# Patient Record
Sex: Male | Born: 1970 | Race: White | Hispanic: No | Marital: Married | Smoking: Never smoker
Health system: Southern US, Community
[De-identification: ages and names within clinical notes are randomized; demographics above are authoritative.]

## PROBLEM LIST (undated history)

## (undated) DIAGNOSIS — R6510 Systemic inflammatory response syndrome (SIRS) of non-infectious origin without acute organ dysfunction: Secondary | ICD-10-CM

## (undated) DIAGNOSIS — M549 Dorsalgia, unspecified: Secondary | ICD-10-CM

## (undated) DIAGNOSIS — S92901A Unspecified fracture of right foot, initial encounter for closed fracture: Secondary | ICD-10-CM

## (undated) DIAGNOSIS — IMO0002 Reserved for concepts with insufficient information to code with codable children: Secondary | ICD-10-CM

## (undated) DIAGNOSIS — S2249XA Multiple fractures of ribs, unspecified side, initial encounter for closed fracture: Secondary | ICD-10-CM

## (undated) DIAGNOSIS — S2239XA Fracture of one rib, unspecified side, initial encounter for closed fracture: Secondary | ICD-10-CM

## (undated) DIAGNOSIS — S92902A Unspecified fracture of left foot, initial encounter for closed fracture: Secondary | ICD-10-CM

## (undated) DIAGNOSIS — R45851 Suicidal ideations: Secondary | ICD-10-CM

## (undated) DIAGNOSIS — G8929 Other chronic pain: Secondary | ICD-10-CM

## (undated) DIAGNOSIS — T148XXA Other injury of unspecified body region, initial encounter: Secondary | ICD-10-CM

## (undated) HISTORY — PX: ROTATOR CUFF REPAIR: SHX139

---

## 1998-04-30 ENCOUNTER — Emergency Department (HOSPITAL_COMMUNITY): Admission: EM | Admit: 1998-04-30 | Discharge: 1998-04-30 | Payer: Self-pay | Admitting: Emergency Medicine

## 1998-05-27 ENCOUNTER — Emergency Department (HOSPITAL_COMMUNITY): Admission: EM | Admit: 1998-05-27 | Discharge: 1998-05-27 | Payer: Self-pay | Admitting: Emergency Medicine

## 1998-12-09 ENCOUNTER — Inpatient Hospital Stay (HOSPITAL_COMMUNITY): Admission: AD | Admit: 1998-12-09 | Discharge: 1998-12-11 | Payer: Self-pay | Admitting: *Deleted

## 2002-07-04 ENCOUNTER — Emergency Department (HOSPITAL_COMMUNITY): Admission: EM | Admit: 2002-07-04 | Discharge: 2002-07-04 | Payer: Self-pay

## 2002-07-04 ENCOUNTER — Encounter: Payer: Self-pay | Admitting: Emergency Medicine

## 2003-12-07 ENCOUNTER — Emergency Department (HOSPITAL_COMMUNITY): Admission: AD | Admit: 2003-12-07 | Discharge: 2003-12-07 | Payer: Self-pay | Admitting: Family Medicine

## 2005-03-29 ENCOUNTER — Emergency Department (HOSPITAL_COMMUNITY): Admission: EM | Admit: 2005-03-29 | Discharge: 2005-03-29 | Payer: Self-pay | Admitting: Family Medicine

## 2005-07-12 ENCOUNTER — Emergency Department (HOSPITAL_COMMUNITY): Admission: EM | Admit: 2005-07-12 | Discharge: 2005-07-12 | Payer: Self-pay | Admitting: Family Medicine

## 2006-06-16 ENCOUNTER — Emergency Department (HOSPITAL_COMMUNITY): Admission: EM | Admit: 2006-06-16 | Discharge: 2006-06-16 | Payer: Self-pay | Admitting: Emergency Medicine

## 2006-08-09 ENCOUNTER — Emergency Department (HOSPITAL_COMMUNITY): Admission: AD | Admit: 2006-08-09 | Discharge: 2006-08-09 | Payer: Self-pay | Admitting: Emergency Medicine

## 2007-03-20 ENCOUNTER — Emergency Department (HOSPITAL_COMMUNITY): Admission: EM | Admit: 2007-03-20 | Discharge: 2007-03-20 | Payer: Self-pay | Admitting: *Deleted

## 2007-11-20 ENCOUNTER — Ambulatory Visit (HOSPITAL_COMMUNITY): Admission: RE | Admit: 2007-11-20 | Discharge: 2007-11-20 | Payer: Self-pay | Admitting: Internal Medicine

## 2008-04-11 ENCOUNTER — Emergency Department (HOSPITAL_COMMUNITY): Admission: EM | Admit: 2008-04-11 | Discharge: 2008-04-11 | Payer: Self-pay | Admitting: Emergency Medicine

## 2009-08-03 ENCOUNTER — Emergency Department (HOSPITAL_COMMUNITY): Admission: EM | Admit: 2009-08-03 | Discharge: 2009-08-03 | Payer: Self-pay | Admitting: Emergency Medicine

## 2009-10-22 ENCOUNTER — Emergency Department (HOSPITAL_COMMUNITY): Admission: EM | Admit: 2009-10-22 | Discharge: 2009-10-22 | Payer: Self-pay | Admitting: Family Medicine

## 2010-11-04 ENCOUNTER — Ambulatory Visit (HOSPITAL_COMMUNITY): Admission: RE | Admit: 2010-11-04 | Discharge: 2010-11-04 | Payer: Self-pay | Admitting: Internal Medicine

## 2011-01-08 ENCOUNTER — Emergency Department (HOSPITAL_COMMUNITY)
Admission: EM | Admit: 2011-01-08 | Discharge: 2011-01-08 | Payer: Self-pay | Source: Home / Self Care | Admitting: Family Medicine

## 2011-01-11 LAB — POCT URINALYSIS DIPSTICK
Bilirubin Urine: NEGATIVE
Hgb urine dipstick: NEGATIVE
Ketones, ur: NEGATIVE mg/dL
Nitrite: NEGATIVE
Protein, ur: NEGATIVE mg/dL
Specific Gravity, Urine: 1.025 (ref 1.005–1.030)
Urine Glucose, Fasting: NEGATIVE mg/dL
Urobilinogen, UA: 0.2 mg/dL (ref 0.0–1.0)
pH: 5.5 (ref 5.0–8.0)

## 2011-01-17 ENCOUNTER — Encounter: Payer: Self-pay | Admitting: Family Medicine

## 2011-02-04 ENCOUNTER — Inpatient Hospital Stay (INDEPENDENT_AMBULATORY_CARE_PROVIDER_SITE_OTHER)
Admission: RE | Admit: 2011-02-04 | Discharge: 2011-02-04 | Disposition: A | Discharge status: Home / Self Care | Payer: BC Managed Care – PPO | Source: Ambulatory Visit | Attending: Family Medicine | Admitting: Family Medicine

## 2011-02-04 DIAGNOSIS — K047 Periapical abscess without sinus: Secondary | ICD-10-CM

## 2011-09-21 LAB — URINALYSIS, ROUTINE W REFLEX MICROSCOPIC
Bilirubin Urine: NEGATIVE
Glucose, UA: NEGATIVE
Hgb urine dipstick: NEGATIVE
Ketones, ur: NEGATIVE
Nitrite: NEGATIVE
Protein, ur: NEGATIVE
Specific Gravity, Urine: 1.025
Urobilinogen, UA: 0.2
pH: 6

## 2012-06-05 ENCOUNTER — Encounter (HOSPITAL_COMMUNITY): Payer: Self-pay

## 2012-06-05 ENCOUNTER — Emergency Department (HOSPITAL_COMMUNITY): Payer: BC Managed Care – PPO

## 2012-06-05 ENCOUNTER — Emergency Department (HOSPITAL_COMMUNITY)
Admission: EM | Admit: 2012-06-05 | Discharge: 2012-06-05 | Disposition: A | Discharge status: Home / Self Care | Payer: BC Managed Care – PPO | Attending: Emergency Medicine | Admitting: Emergency Medicine

## 2012-06-05 DIAGNOSIS — M25519 Pain in unspecified shoulder: Secondary | ICD-10-CM | POA: Insufficient documentation

## 2012-06-05 DIAGNOSIS — T148XXA Other injury of unspecified body region, initial encounter: Secondary | ICD-10-CM

## 2012-06-05 DIAGNOSIS — G8929 Other chronic pain: Secondary | ICD-10-CM | POA: Insufficient documentation

## 2012-06-05 DIAGNOSIS — M549 Dorsalgia, unspecified: Secondary | ICD-10-CM | POA: Insufficient documentation

## 2012-06-05 DIAGNOSIS — Z043 Encounter for examination and observation following other accident: Secondary | ICD-10-CM | POA: Insufficient documentation

## 2012-06-05 HISTORY — DX: Other chronic pain: G89.29

## 2012-06-05 HISTORY — DX: Multiple fractures of ribs, unspecified side, initial encounter for closed fracture: S22.49XA

## 2012-06-05 HISTORY — DX: Fracture of one rib, unspecified side, initial encounter for closed fracture: S22.39XA

## 2012-06-05 HISTORY — DX: Dorsalgia, unspecified: M54.9

## 2012-06-05 HISTORY — DX: Reserved for concepts with insufficient information to code with codable children: IMO0002

## 2012-06-05 HISTORY — DX: Unspecified fracture of right foot, initial encounter for closed fracture: S92.901A

## 2012-06-05 HISTORY — DX: Unspecified fracture of left foot, initial encounter for closed fracture: S92.902A

## 2012-06-05 MED ORDER — OXYCODONE-ACETAMINOPHEN 5-325 MG PO TABS: 1.0000 | ORAL_TABLET | ORAL | Status: DC | PRN

## 2012-06-05 MED ORDER — IBUPROFEN 600 MG PO TABS: 600.0000 mg | ORAL_TABLET | Freq: Four times a day (QID) | ORAL | Status: DC | PRN
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
## 2012-06-12 ENCOUNTER — Emergency Department (HOSPITAL_COMMUNITY)
Admission: EM | Admit: 2012-06-12 | Discharge: 2012-06-12 | Disposition: A | Discharge status: Home / Self Care | Payer: No Typology Code available for payment source

## 2012-06-13 ENCOUNTER — Emergency Department (HOSPITAL_COMMUNITY)
Admission: EM | Admit: 2012-06-13 | Discharge: 2012-06-13 | Disposition: A | Discharge status: Home / Self Care | Payer: BC Managed Care – PPO | Attending: Emergency Medicine | Admitting: Emergency Medicine

## 2012-06-13 ENCOUNTER — Encounter (HOSPITAL_COMMUNITY): Payer: Self-pay | Admitting: Emergency Medicine

## 2012-06-13 DIAGNOSIS — S139XXA Sprain of joints and ligaments of unspecified parts of neck, initial encounter: Secondary | ICD-10-CM | POA: Insufficient documentation

## 2012-06-13 DIAGNOSIS — M62838 Other muscle spasm: Secondary | ICD-10-CM

## 2012-06-13 DIAGNOSIS — Z880 Allergy status to penicillin: Secondary | ICD-10-CM | POA: Insufficient documentation

## 2012-06-13 DIAGNOSIS — G8929 Other chronic pain: Secondary | ICD-10-CM | POA: Insufficient documentation

## 2012-06-13 DIAGNOSIS — S161XXA Strain of muscle, fascia and tendon at neck level, initial encounter: Secondary | ICD-10-CM

## 2012-06-13 MED ORDER — KETOROLAC TROMETHAMINE 60 MG/2ML IM SOLN
60.0000 mg | Freq: Once | INTRAMUSCULAR | Status: AC
  Administered 2012-06-13: 60 mg via INTRAMUSCULAR
  Filled 2012-06-13: qty 2

## 2012-06-13 MED ORDER — DIAZEPAM 5 MG PO TABS
5.0000 mg | ORAL_TABLET | Freq: Three times a day (TID) | ORAL | Status: AC | PRN
Start: 2012-06-13 — End: 2012-06-23

## 2012-06-13 MED ORDER — DIAZEPAM 5 MG PO TABS
5.0000 mg | ORAL_TABLET | Freq: Once | ORAL | Status: AC
  Administered 2012-06-13: 5 mg via ORAL
  Filled 2012-06-13: qty 1

## 2012-06-13 MED ORDER — HYDROCODONE-ACETAMINOPHEN 5-325 MG PO TABS
1.0000 | ORAL_TABLET | Freq: Once | ORAL | Status: AC
  Administered 2012-06-13: 1 via ORAL
  Filled 2012-06-13: qty 1

## 2012-06-13 MED ORDER — HYDROCODONE-ACETAMINOPHEN 5-325 MG PO TABS: ORAL_TABLET | ORAL | Status: AC
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
## 2012-06-13 NOTE — Discharge Instructions (Signed)
Cervical Sprain A cervical sprain is an injury in the neck in which the ligaments are stretched or torn. The ligaments are the tissues that hold the bones of the neck (vertebrae) in place.Cervical sprains can range from very mild to very severe. Most cervical sprains get better in 1 to 3 weeks, but it depends on the cause and extent of the injury. Severe cervical sprains can cause the neck vertebrae to be unstable. This can lead to damage of the spinal cord and can result in serious nervous system problems. Your caregiver will determine whether your cervical sprain is mild or severe. CAUSES  Severe cervical sprains may be caused by:  Contact sport injuries (football, rugby, wrestling, hockey, auto racing, gymnastics, diving, martial arts, boxing).   Motor vehicle collisions.   Whiplash injuries. This means the neck is forcefully whipped backward and forward.   Falls.  Mild cervical sprains may be caused by:   Awkward positions, such as cradling a telephone between your ear and shoulder.   Sitting in a chair that does not offer proper support.   Working at a poorly designed computer station.   Activities that require looking up or down for long periods of time.  SYMPTOMS   Pain, soreness, stiffness, or a burning sensation in the front, back, or sides of the neck. This discomfort may develop immediately after injury or it may develop slowly and not begin for 24 hours or more after an injury.   Pain or tenderness directly in the middle of the back of the neck.   Shoulder or upper back pain.   Limited ability to move the neck.   Headache.   Dizziness.   Weakness, numbness, or tingling in the hands or arms.   Muscle spasms.   Difficulty swallowing or chewing.   Tenderness and swelling of the neck.  DIAGNOSIS  Most of the time, your caregiver can diagnose this problem by taking your history and doing a physical exam. Your caregiver will ask about any known problems, such as  arthritis in the neck or a previous neck injury. X-rays may be taken to find out if there are any other problems, such as problems with the bones of the neck. However, an X-ray often does not reveal the full extent of a cervical sprain. Other tests such as a computed tomography (CT) scan or magnetic resonance imaging (MRI) may be needed. TREATMENT  Treatment depends on the severity of the cervical sprain. Mild sprains can be treated with rest, keeping the neck in place (immobilization), and pain medicines. Severe cervical sprains need immediate immobilization and an appointment with an orthopedist or neurosurgeon. Several treatment options are available to help with pain, muscle spasms, and other symptoms. Your caregiver may prescribe:  Medicines, such as pain relievers, numbing medicines, or muscle relaxants.   Physical therapy. This can include stretching exercises, strengthening exercises, and posture training. Exercises and improved posture can help stabilize the neck, strengthen muscles, and help stop symptoms from returning.   A neck collar to be worn for short periods of time. Often, these collars are worn for comfort. However, certain collars may be worn to protect the neck and prevent further worsening of a serious cervical sprain.  HOME CARE INSTRUCTIONS   Put ice on the injured area.   Put ice in a plastic bag.   Place a towel between your skin and the bag.   Leave the ice on for 15 to 20 minutes, 3 to 4 times a day.     Only take over-the-counter or prescription medicines for pain, discomfort, or fever as directed by your caregiver.   Keep all follow-up appointments as directed by your caregiver.   Keep all physical therapy appointments as directed by your caregiver.   If a neck collar is prescribed, wear it as directed by your caregiver.   Do not drive while wearing a neck collar.   Make any needed adjustments to your work station to promote good posture.   Avoid positions  and activities that make your symptoms worse.   Warm up and stretch before being active to help prevent problems.  SEEK MEDICAL CARE IF:   Your pain is not controlled with medicine.   You are unable to decrease your pain medicine over time as planned.   Your activity level is not improving as expected.  SEEK IMMEDIATE MEDICAL CARE IF:   You develop any bleeding, stomach upset, or signs of an allergic reaction to your medicine.   Your symptoms get worse.   You develop new, unexplained symptoms.   You have numbness, tingling, weakness, or paralysis in any part of your body.  MAKE SURE YOU:   Understand these instructions.   Will watch your condition.   Will get help right away if you are not doing well or get worse.  Document Released: 10/10/2007 Document Revised: 12/02/2011 Document Reviewed: 09/15/2011 [employer] Patient Information 2012 [employer].    Narcotic and benzodiazepine use may cause drowsiness, slowed breathing or dependence.  Please use with caution and do not drive, operate machinery or watch young children alone while taking them.  Taking combinations of these medications or drinking alcohol will potentiate these effects.    

## 2012-06-19 ENCOUNTER — Other Ambulatory Visit (HOSPITAL_COMMUNITY): Payer: Self-pay | Admitting: Chiropractic Medicine

## 2012-06-19 ENCOUNTER — Ambulatory Visit (HOSPITAL_COMMUNITY)
Admission: RE | Admit: 2012-06-19 | Discharge: 2012-06-19 | Disposition: A | Discharge status: Home / Self Care | Payer: BC Managed Care – PPO | Source: Ambulatory Visit | Attending: Chiropractic Medicine | Admitting: Chiropractic Medicine

## 2012-06-19 DIAGNOSIS — M47812 Spondylosis without myelopathy or radiculopathy, cervical region: Secondary | ICD-10-CM | POA: Insufficient documentation

## 2012-06-19 DIAGNOSIS — M542 Cervicalgia: Secondary | ICD-10-CM

## 2012-06-19 DIAGNOSIS — M79609 Pain in unspecified limb: Secondary | ICD-10-CM | POA: Insufficient documentation

## 2012-10-17 ENCOUNTER — Encounter (HOSPITAL_COMMUNITY): Payer: Self-pay | Admitting: Cardiology

## 2012-10-17 ENCOUNTER — Emergency Department (HOSPITAL_COMMUNITY)
Admission: EM | Admit: 2012-10-17 | Discharge: 2012-10-17 | Disposition: A | Discharge status: Home / Self Care | Payer: Self-pay | Attending: Emergency Medicine | Admitting: Emergency Medicine

## 2012-10-17 DIAGNOSIS — F19939 Other psychoactive substance use, unspecified with withdrawal, unspecified: Secondary | ICD-10-CM | POA: Insufficient documentation

## 2012-10-17 DIAGNOSIS — M549 Dorsalgia, unspecified: Secondary | ICD-10-CM | POA: Insufficient documentation

## 2012-10-17 DIAGNOSIS — R110 Nausea: Secondary | ICD-10-CM | POA: Insufficient documentation

## 2012-10-17 NOTE — ED Notes (Signed)
Pt reports hx of back pain that he has been taking pain medication for and has run out. Reports that he now has generalized nausea and back pain. Thinks that he may be going through withdrawal from medication.

## 2013-07-03 ENCOUNTER — Emergency Department (HOSPITAL_COMMUNITY)
Admission: EM | Admit: 2013-07-03 | Discharge: 2013-07-03 | Disposition: A | Discharge status: Home / Self Care | Payer: Self-pay | Attending: Emergency Medicine | Admitting: Emergency Medicine

## 2013-07-03 ENCOUNTER — Emergency Department (HOSPITAL_COMMUNITY): Payer: Self-pay

## 2013-07-03 ENCOUNTER — Encounter (HOSPITAL_COMMUNITY): Payer: Self-pay | Admitting: *Deleted

## 2013-07-03 DIAGNOSIS — Z8781 Personal history of (healed) traumatic fracture: Secondary | ICD-10-CM | POA: Insufficient documentation

## 2013-07-03 DIAGNOSIS — Z8739 Personal history of other diseases of the musculoskeletal system and connective tissue: Secondary | ICD-10-CM | POA: Insufficient documentation

## 2013-07-03 DIAGNOSIS — R109 Unspecified abdominal pain: Secondary | ICD-10-CM | POA: Insufficient documentation

## 2013-07-03 DIAGNOSIS — R1030 Lower abdominal pain, unspecified: Secondary | ICD-10-CM

## 2013-07-03 LAB — CBC WITH DIFFERENTIAL/PLATELET
Basophils Relative: 0 % (ref 0–1)
Eosinophils Absolute: 0 10*3/uL (ref 0.0–0.7)
Hemoglobin: 17.4 g/dL — ABNORMAL HIGH (ref 13.0–17.0)
MCHC: 36.9 g/dL — ABNORMAL HIGH (ref 30.0–36.0)
Monocytes Relative: 6 % (ref 3–12)
Neutro Abs: 6.6 10*3/uL (ref 1.7–7.7)
Neutrophils Relative %: 66 % (ref 43–77)
Platelets: 240 10*3/uL (ref 150–400)
RBC: 5.38 MIL/uL (ref 4.22–5.81)

## 2013-07-03 LAB — BASIC METABOLIC PANEL
BUN: 9 mg/dL (ref 6–23)
Chloride: 99 mEq/L (ref 96–112)
GFR calc Af Amer: >90 mL/min (ref >90)
GFR calc non Af Amer: 79 mL/min — ABNORMAL LOW (ref >90)
Potassium: 3.9 mEq/L (ref 3.5–5.1)
Sodium: 138 mEq/L (ref 135–145)

## 2013-07-03 MED ORDER — MORPHINE SULFATE 4 MG/ML IJ SOLN
4.0000 mg | Freq: Once | INTRAMUSCULAR | Status: AC
  Administered 2013-07-03: 4 mg via INTRAVENOUS
  Filled 2013-07-03: qty 1

## 2013-07-03 MED ORDER — OXYCODONE-ACETAMINOPHEN 5-325 MG PO TABS
2.0000 | ORAL_TABLET | Freq: Once | ORAL | Status: AC
Start: 2013-07-03 — End: 2013-07-03
  Administered 2013-07-03: 2 via ORAL
  Filled 2013-07-03: qty 2

## 2013-07-03 MED ORDER — OXYCODONE-ACETAMINOPHEN 5-325 MG PO TABS: 2.0000 | ORAL_TABLET | Freq: Four times a day (QID) | ORAL | Status: DC | PRN
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
## 2013-07-03 NOTE — ED Provider Notes (Signed)
History  This chart was scribed for Roxy Horseman - PA by Manuela Schwartz, ED scribe. This patient was seen in room TR11C/TR11C and the patient's care was started at 2106.  CSN: [id number] Arrival date & time 07/03/13  1947  First MD Initiated Contact with Patient 07/03/13 2106     Chief Complaint  Patient presents with  . Groin Pain   The history is provided by the patient. No language interpreter was used.   HPI Comments: Aaron [name] is a 43 y.o. male who presents to the Emergency Department complaining of sudden onset, constant, 10/10 scrotum pain onset 6 hours ago while having sexual intercourse w/wife. He states it felt like something pulled in his groin and began w/immediate pain to area of his scrotum. He denies d/c from penis and denies any lumps or masses. He states pain is worse with pressure to area or while walking and he has not taken any medicines for this problem at home. He denies any previous similar episodes.    Past Medical History  Diagnosis Date  . Broken ribs   . Foot fracture, left   . Foot fracture, right   . Chronic back pain   . Herniated disc    Past Surgical History  Procedure Laterality Date  . Rotator cuff repair     No family history on file. History  Substance Use Topics  . Smoking status: Never Smoker   . Smokeless tobacco: Not on file  . Alcohol Use: Yes    Review of Systems  Constitutional: Negative for fever and chills.  HENT: Negative for congestion and rhinorrhea.   Respiratory: Negative for shortness of breath.   Cardiovascular: Negative for chest pain.  Gastrointestinal: Negative for nausea, vomiting and abdominal pain.  Genitourinary: Negative for discharge.       Scrotum pain  Musculoskeletal: Negative for back pain.  Neurological: Negative for syncope, weakness and headaches.  All other systems reviewed and are negative.   A complete 10 system review of systems was obtained and all systems are negative except as noted in the  HPI and PMH.   Allergies  Penicillins  Home Medications   Current Outpatient Rx  Name  Route  Sig  Dispense  Refill  . diazepam (VALIUM) 5 MG tablet   Oral   Take 5 mg by mouth every 6 (six) hours as needed. For pain          Triage Vitals: BP 154/114  Pulse 83  Temp(Src) 98.2 F (36.8 C) (Oral)  Resp 16  SpO2 98% Physical Exam  Nursing note and vitals reviewed. Constitutional: He is oriented to person, place, and time. He appears well-developed and well-nourished. No distress.  HENT:  Head: Normocephalic and atraumatic.  Eyes: EOM are normal.  Neck: Neck supple. No tracheal deviation present.  Cardiovascular: Normal rate.   Pulmonary/Chest: Effort normal. No respiratory distress.  Abdominal: Soft. He exhibits no distension. There is no tenderness.  No masses, no hernias.  Genitourinary:  Inferior scrotum tender to palpation, no masses, lumps or bulges. No obvious swelling, no penile d/c, no hernias.   Musculoskeletal: Normal range of motion.  Neurological: He is alert and oriented to person, place, and time.  Skin: Skin is warm and dry.  Psychiatric: He has a normal mood and affect. His behavior is normal.    ED Course  Procedures (including critical care time) DIAGNOSTIC STUDIES: Oxygen Saturation is 98% on room air, normal by my interpretation.    COORDINATION OF CARE:  At 1020 PM Discussed treatment plan with patient which includes testicular US, pain medicine, blood work. Patient agrees.   Results for orders placed during the hospital encounter of 07/03/13  CBC WITH DIFFERENTIAL      Result Value Range   WBC 10.0  4.0 - 10.5 K/uL   RBC 5.38  4.22 - 5.81 MIL/uL   Hemoglobin 17.4 (*) 13.0 - 17.0 g/dL   HCT 16.1  09.6 - 04.5 %   MCV 87.7  78.0 - 100.0 fL   MCH 32.3  26.0 - 34.0 pg   MCHC 36.9 (*) 30.0 - 36.0 g/dL   RDW 40.9  81.1 - 91.4 %   Platelets 240  150 - 400 K/uL   Neutrophils Relative % 66  43 - 77 %   Neutro Abs 6.6  1.7 - 7.7 K/uL    Lymphocytes Relative 27  12 - 46 %   Lymphs Abs 2.7  0.7 - 4.0 K/uL   Monocytes Relative 6  3 - 12 %   Monocytes Absolute 0.6  0.1 - 1.0 K/uL   Eosinophils Relative 0  0 - 5 %   Eosinophils Absolute 0.0  0.0 - 0.7 K/uL   Basophils Relative 0  0 - 1 %   Basophils Absolute 0.0  0.0 - 0.1 K/uL  BASIC METABOLIC PANEL      Result Value Range   Sodium 138  135 - 145 mEq/L   Potassium 3.9  3.5 - 5.1 mEq/L   Chloride 99  96 - 112 mEq/L   CO2 26  19 - 32 mEq/L   Glucose, Bld 109 (*) 70 - 99 mg/dL   BUN 9  6 - 23 mg/dL   Creatinine, Ser 7.82  0.50 - 1.35 mg/dL   Calcium 9.8  8.4 - 95.6 mg/dL   GFR calc non Af Amer 79 (*) >90 mL/min   GFR calc Af Amer >90  >90 mL/min   US Scrotum  07/03/2013   *RADIOLOGY REPORT*  Clinical Data:  Left groin and left scrotal pain. Pain began during intercourse.  SCROTAL ULTRASOUND DOPPLER ULTRASOUND OF THE TESTICLES  Technique: Complete ultrasound examination of the testicles, epididymis, and other scrotal structures was performed.  Color and spectral Doppler ultrasound were also utilized to evaluate blood flow to the testicles.  Comparison:  None.  Findings:  Right testis:  The right testis measures 4 x 2.2 x 2.7 cm.  Normal parenchymal echotexture.  No focal mass lesions demonstrated. Normal homogeneous flow on color flow Doppler imaging.  Left testis:  The left testis measures 4.2 x 2.6 x 2.2 cm.  Normal homogeneous parenchymal echotexture.  No focal mass lesions demonstrated.  Normal homogeneous flow on color flow Doppler imaging.  Right epididymis:  Normal in size and appearance.  Left epididymis:  Normal in size and appearance.  Hydrocele:  No hydroceles.  Varicocele:  No varicoceles.  Pulsed Doppler interrogation of both testes demonstrates low resistance flow bilaterally. No abnormal scrotal fluid collections or skin thickening.  IMPRESSION: Normal ultrasound appearance of the testicles.  No evidence of torsion.   Original Report Authenticated By: Burman Nieves,  M.D.   [place] Art/ven Flow Abd Pelv Doppler  07/03/2013   *RADIOLOGY REPORT*  Clinical Data:  Left groin and left scrotal pain. Pain began during intercourse.  SCROTAL ULTRASOUND DOPPLER ULTRASOUND OF THE TESTICLES  Technique: Complete ultrasound examination of the testicles, epididymis, and other scrotal structures was performed.  Color and spectral Doppler ultrasound were also utilized to evaluate  blood flow to the testicles.  Comparison:  None.  Findings:  Right testis:  The right testis measures 4 x 2.2 x 2.7 cm.  Normal parenchymal echotexture.  No focal mass lesions demonstrated. Normal homogeneous flow on color flow Doppler imaging.  Left testis:  The left testis measures 4.2 x 2.6 x 2.2 cm.  Normal homogeneous parenchymal echotexture.  No focal mass lesions demonstrated.  Normal homogeneous flow on color flow Doppler imaging.  Right epididymis:  Normal in size and appearance.  Left epididymis:  Normal in size and appearance.  Hydrocele:  No hydroceles.  Varicocele:  No varicoceles.  Pulsed Doppler interrogation of both testes demonstrates low resistance flow bilaterally. No abnormal scrotal fluid collections or skin thickening.  IMPRESSION: Normal ultrasound appearance of the testicles.  No evidence of torsion.   Original Report Authenticated By: Burman Nieves, M.D.     1. Groin pain     MDM  Patient with groin strain.  Recommend RICE, NSAIDs, and pain medicine.  Urology follow-up.  Patient is stable and ready for discharge.  Discussed with Dr. Ethelda Chick, who agrees with the plan.  No evidence of hernia.  Roxy Horseman, PA-C 07/03/13 2339

## 2013-07-04 NOTE — ED Provider Notes (Signed)
Medical screening examination/treatment/procedure(s) were performed by non-physician practitioner and as supervising physician I was immediately available for consultation/collaboration.  Quynn Vilchis, MD 07/04/13 0147 

## 2013-08-18 ENCOUNTER — Encounter (HOSPITAL_COMMUNITY): Payer: Self-pay | Admitting: Emergency Medicine

## 2013-08-18 ENCOUNTER — Emergency Department (HOSPITAL_COMMUNITY)
Admission: EM | Admit: 2013-08-18 | Discharge: 2013-08-18 | Disposition: A | Discharge status: Home / Self Care | Payer: Self-pay | Attending: Emergency Medicine | Admitting: Emergency Medicine

## 2013-08-18 DIAGNOSIS — Z880 Allergy status to penicillin: Secondary | ICD-10-CM | POA: Insufficient documentation

## 2013-08-18 DIAGNOSIS — G8929 Other chronic pain: Secondary | ICD-10-CM | POA: Insufficient documentation

## 2013-08-18 DIAGNOSIS — Z87828 Personal history of other (healed) physical injury and trauma: Secondary | ICD-10-CM | POA: Insufficient documentation

## 2013-08-18 DIAGNOSIS — Z8781 Personal history of (healed) traumatic fracture: Secondary | ICD-10-CM | POA: Insufficient documentation

## 2013-08-18 DIAGNOSIS — M5416 Radiculopathy, lumbar region: Secondary | ICD-10-CM

## 2013-08-18 DIAGNOSIS — M545 Low back pain, unspecified: Secondary | ICD-10-CM | POA: Insufficient documentation

## 2013-08-18 DIAGNOSIS — M6281 Muscle weakness (generalized): Secondary | ICD-10-CM | POA: Insufficient documentation

## 2013-08-18 DIAGNOSIS — IMO0002 Reserved for concepts with insufficient information to code with codable children: Secondary | ICD-10-CM | POA: Insufficient documentation

## 2013-08-18 DIAGNOSIS — M549 Dorsalgia, unspecified: Secondary | ICD-10-CM

## 2013-08-18 DIAGNOSIS — R209 Unspecified disturbances of skin sensation: Secondary | ICD-10-CM | POA: Insufficient documentation

## 2013-08-18 DIAGNOSIS — M62838 Other muscle spasm: Secondary | ICD-10-CM | POA: Insufficient documentation

## 2013-08-18 DIAGNOSIS — Z8739 Personal history of other diseases of the musculoskeletal system and connective tissue: Secondary | ICD-10-CM | POA: Insufficient documentation

## 2013-08-18 MED ORDER — OXYCODONE-ACETAMINOPHEN 5-325 MG PO TABS
1.0000 | ORAL_TABLET | Freq: Once | ORAL | Status: AC
  Administered 2013-08-18: 1 via ORAL
  Filled 2013-08-18: qty 1

## 2013-08-18 MED ORDER — DIAZEPAM 5 MG PO TABS
5.0000 mg | ORAL_TABLET | Freq: Once | ORAL | Status: AC
  Administered 2013-08-18: 5 mg via ORAL
  Filled 2013-08-18: qty 1

## 2013-08-18 MED ORDER — CYCLOBENZAPRINE HCL 10 MG PO TABS: 10.0000 mg | ORAL_TABLET | Freq: Two times a day (BID) | ORAL | Status: DC | PRN

## 2013-08-18 MED ORDER — HYDROCODONE-ACETAMINOPHEN 5-325 MG PO TABS: 1.0000 | ORAL_TABLET | Freq: Four times a day (QID) | ORAL | Status: DC | PRN

## 2013-08-18 MED ORDER — KETOROLAC TROMETHAMINE 60 MG/2ML IM SOLN
60.0000 mg | Freq: Once | INTRAMUSCULAR | Status: AC
  Administered 2013-08-18: 60 mg via INTRAMUSCULAR
  Filled 2013-08-18: qty 2

## 2013-08-18 MED ORDER — PREDNISONE 20 MG PO TABS: 40.0000 mg | ORAL_TABLET | Freq: Every day | ORAL | Status: DC

## 2013-08-18 NOTE — ED Provider Notes (Signed)
Medical screening examination/treatment/procedure(s) were performed by non-physician practitioner and as supervising physician I was immediately available for consultation/collaboration.   Dagmar Hait, MD 08/18/13 [phone]
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
## 2013-08-18 NOTE — ED Notes (Addendum)
Pt c/o chronic back pain.  

## 2013-09-13 ENCOUNTER — Encounter (HOSPITAL_COMMUNITY): Payer: Self-pay | Admitting: *Deleted

## 2013-09-13 ENCOUNTER — Emergency Department (HOSPITAL_COMMUNITY)
Admission: EM | Admit: 2013-09-13 | Discharge: 2013-09-13 | Disposition: A | Discharge status: Home / Self Care | Payer: BC Managed Care – PPO | Attending: Emergency Medicine | Admitting: Emergency Medicine

## 2013-09-13 DIAGNOSIS — H538 Other visual disturbances: Secondary | ICD-10-CM | POA: Insufficient documentation

## 2013-09-13 DIAGNOSIS — K0889 Other specified disorders of teeth and supporting structures: Secondary | ICD-10-CM

## 2013-09-13 DIAGNOSIS — Z8781 Personal history of (healed) traumatic fracture: Secondary | ICD-10-CM | POA: Insufficient documentation

## 2013-09-13 DIAGNOSIS — Z880 Allergy status to penicillin: Secondary | ICD-10-CM | POA: Insufficient documentation

## 2013-09-13 DIAGNOSIS — Z8739 Personal history of other diseases of the musculoskeletal system and connective tissue: Secondary | ICD-10-CM | POA: Insufficient documentation

## 2013-09-13 DIAGNOSIS — R220 Localized swelling, mass and lump, head: Secondary | ICD-10-CM | POA: Insufficient documentation

## 2013-09-13 DIAGNOSIS — K089 Disorder of teeth and supporting structures, unspecified: Secondary | ICD-10-CM | POA: Insufficient documentation

## 2013-09-13 DIAGNOSIS — G8929 Other chronic pain: Secondary | ICD-10-CM | POA: Insufficient documentation

## 2013-09-13 MED ORDER — CLINDAMYCIN HCL 150 MG PO CAPS: 300.0000 mg | ORAL_CAPSULE | Freq: Three times a day (TID) | ORAL | Status: DC

## 2013-09-13 MED ORDER — HYDROCODONE-ACETAMINOPHEN 5-325 MG PO TABS
2.0000 | ORAL_TABLET | Freq: Once | ORAL | Status: AC
  Administered 2013-09-13: 2 via ORAL
  Filled 2013-09-13: qty 2

## 2013-09-13 MED ORDER — HYDROCODONE-ACETAMINOPHEN 5-325 MG PO TABS: 2.0000 | ORAL_TABLET | ORAL | Status: DC | PRN

## 2013-09-13 MED ORDER — AMOXICILLIN 500 MG PO CAPS: 500.0000 mg | ORAL_CAPSULE | Freq: Three times a day (TID) | ORAL | Status: DC

## 2013-09-13 NOTE — ED Notes (Signed)
Provided patient with [facility] information for low income/no insurance dental f/u.

## 2013-09-13 NOTE — ED Notes (Signed)
Pt reports having left upper side dental abscess for 2 days. Airway intact.

## 2013-09-13 NOTE — ED Provider Notes (Signed)
CSN: [id number]     Arrival date & time 09/13/13  1114 History  This chart was scribed for non-physician practitioner, Coral Ceo, PA,  working with Shon Baton, MD, by [facility] ED Scribe. This patient was seen in room TR05C/TR05C and the patient's care was started at 2:34 PM.   Chief Complaint  Patient presents with  . Dental Pain    The history is provided by the patient. No language interpreter was used.   HPI Comments: Aaron [name] is a 42 y.o. male who presents to the Emergency Department complaining of gradually worsening, constant, severe upper left dental pain that radiates to his left eye over the past 3 days. He describes his pain as "soreness". He believes he has an abscess to his gums in that area. He reports associated facial swelling. He also reports associated blurred vision in his left eye with no loss of vision onset this morning. He states that his pain is worsened with clenching his teeth. He states that he has taken Ibuprofen without relief of pain. He states that he has been out of work for 3 days due to pain. He states that he did not drive himself to the ED. He denies fever, diffuculty swallowing or breathing, rhinorrhea, neck pain, ear pain or any other symptoms. Pt denies any history of smoking and is an occasional alcohol user.  He does not currently have a dentist.     Past Medical History  Diagnosis Date  . Broken ribs   . Foot fracture, left   . Foot fracture, right   . Chronic back pain   . Herniated disc    Past Surgical History  Procedure Laterality Date  . Rotator cuff repair     History reviewed. No pertinent family history. History  Substance Use Topics  . Smoking status: Never Smoker   . Smokeless tobacco: Not on file  . Alcohol Use: Yes    Review of Systems  Constitutional: Negative for fever, chills, diaphoresis, activity change, appetite change and fatigue.  HENT: Positive for facial swelling and dental problem. Negative for  hearing loss, ear pain, congestion, sore throat, rhinorrhea, sneezing, drooling, mouth sores, trouble swallowing, neck pain, neck stiffness, sinus pressure and ear discharge.   Eyes: Positive for pain (Left eye) and visual disturbance (blurred vision, left eye). Negative for photophobia, discharge, redness and itching.  Respiratory: Negative for cough and shortness of breath.   Cardiovascular: Negative for chest pain and leg swelling.  Gastrointestinal: Negative for nausea, vomiting, abdominal pain, diarrhea and constipation.  Genitourinary: Negative for dysuria.  Musculoskeletal: Negative for back pain and gait problem.  Skin: Negative for color change, rash and wound.  Neurological: Negative for dizziness, weakness, light-headedness, numbness and headaches.  Psychiatric/Behavioral: Negative for confusion.  All other systems reviewed and are negative.   Allergies  Penicillins  Home Medications  No current outpatient prescriptions on file.  Triage Vitals: BP 142/75  Pulse 77  Temp(Src) 98.4 F (36.9 C) (Oral)  Resp 18  Ht 5\' 11"  (1.803 m)  Wt 220 lb (99.791 kg)  BMI 30.7 kg/m2  SpO2 96%  Filed Vitals:   09/13/13 1129 09/13/13 1551  BP: 142/75 108/76  Pulse: 77 65  Temp: 98.4 F (36.9 C)   TempSrc: Oral   Resp: 18 16  Height: 5\' 11"  (1.803 m)   Weight: 220 lb (99.791 kg)   SpO2: 96% 99%    Physical Exam  Nursing note and vitals reviewed. Constitutional: He is oriented to  person, place, and time. He appears well-developed and well-nourished. No distress.  HENT:  Head: Normocephalic and atraumatic.    Right Ear: External ear normal.  Left Ear: External ear normal.  Nose: Nose normal.  Mouth/Throat: Oropharynx is clear and moist. No oropharyngeal exudate.    Uvula midline.  No trismus.  No visible abscesses throughout the oral cavity.  Left upper back molar has evidence of dental caries.  No palpable masses throughout the face.  Tenderness to palpation to the left  upper cheek throughout.   Eyes: Conjunctivae and EOM are normal. Pupils are equal, round, and reactive to light. Right eye exhibits no discharge. Left eye exhibits no discharge.  Neck: Normal range of motion. Neck supple. No tracheal deviation present.  No LAD.  No palpable masses or edema to the jaw or neck throughout.  No limitations with ROM.    Cardiovascular: Normal rate, regular rhythm and normal heart sounds.  Exam reveals no gallop and no friction rub.   No murmur heard. Pulmonary/Chest: Effort normal and breath sounds normal. No respiratory distress. He has no wheezes. He has no rales. He exhibits no tenderness.  Abdominal: Soft. He exhibits no distension. There is no tenderness.  Musculoskeletal: Normal range of motion. He exhibits no edema and no tenderness.  Neurological: He is alert and oriented to person, place, and time.  Skin: Skin is warm and dry. He is not diaphoretic.  Psychiatric: He has a normal mood and affect. His behavior is normal.    ED Course  Procedures (including critical care time)  DIAGNOSTIC STUDIES: Oxygen Saturation is 96% on RA, normal by my interpretation.    COORDINATION OF CARE: 2:39 PM- Pt advised of plan for treatment, including plan to receive Norco and pt agrees.  Medications  HYDROcodone-acetaminophen (NORCO/VICODIN) 5-325 MG per tablet 2 tablet (2 tablets Oral Given 09/13/13 1455)   Labs Review Labs Reviewed - No data to display Imaging Review No results found.  MDM   1. Pain, dental     Aaron [name] is a 42 y.o. male who presents to the Emergency Department complaining of dental pain.  Vicodin ordered for symptomatic relief.  Visual acuity ordered.     Rechecks  3:45 PM = Patient states he feels better after the vicodin.  He states that after I examined him he felt like something "popped" in his face and he feels relief of pressure and that something is "draining."  Re-examined patient and saw no evidence of drainage.  Patient  states he is going to call his wife's dentist after discharge.    3:51 PM = Visual Acuity - Bilateral Distance: 20/20 ; R Distance: 20/30 ; L Distance: 20/25    Etiology of dental pain is likely due to dental caries and a developing dental abscess.  There was no evidence of an abscess to be drained on my exam in the ED.  Patient was afebrile and non-toxic in appearance.  He had no edema to the neck or jaw throughout.  He had some relief of his pain in the ED.  Patient was prescribed clindamycin and vicodin for outpatient management.  Patient states he is allergic to penicillin and his reaction is "swelling."  Patient was instructed to return to the ED if he experiences any fever, difficulty breathing/controlling secretions, neck or jaw swelling, or other concerns.  He was instructed to follow-up with a dentist as soon as possible.  He was provided dental resources before discharge.  Patient was in agreement with  discharge and plan.     Final impressions: 1. Dental pain    Thomasenia Sales   I personally performed the services described in this documentation, which was scribed in my presence. The recorded information has been reviewed and is accurate.   Jillyn Ledger, PA-C 09/14/13 1103

## 2013-09-13 NOTE — ED Notes (Signed)
C/o left upper dental pain x 3 days. Swelling to left face. States has been out of work x 3 days.

## 2013-09-14 NOTE — ED Provider Notes (Signed)
Medical screening examination/treatment/procedure(s) were performed by non-physician practitioner and as supervising physician I was immediately available for consultation/collaboration.   Shon Baton, MD 09/14/13 [phone]

## 2013-10-18 ENCOUNTER — Emergency Department (HOSPITAL_COMMUNITY)
Admission: EM | Admit: 2013-10-18 | Discharge: 2013-10-18 | Disposition: A | Discharge status: Home / Self Care | Payer: No Typology Code available for payment source | Attending: Emergency Medicine | Admitting: Emergency Medicine

## 2013-10-18 ENCOUNTER — Other Ambulatory Visit (HOSPITAL_COMMUNITY): Payer: Self-pay | Admitting: Internal Medicine

## 2013-10-18 ENCOUNTER — Encounter (HOSPITAL_COMMUNITY): Payer: Self-pay | Admitting: Emergency Medicine

## 2013-10-18 ENCOUNTER — Ambulatory Visit (HOSPITAL_COMMUNITY)
Admission: RE | Admit: 2013-10-18 | Discharge: 2013-10-18 | Disposition: A | Discharge status: Home / Self Care | Payer: No Typology Code available for payment source | Source: Ambulatory Visit | Attending: Internal Medicine | Admitting: Internal Medicine

## 2013-10-18 DIAGNOSIS — Z79899 Other long term (current) drug therapy: Secondary | ICD-10-CM | POA: Insufficient documentation

## 2013-10-18 DIAGNOSIS — Z8781 Personal history of (healed) traumatic fracture: Secondary | ICD-10-CM | POA: Insufficient documentation

## 2013-10-18 DIAGNOSIS — M545 Low back pain, unspecified: Secondary | ICD-10-CM | POA: Insufficient documentation

## 2013-10-18 DIAGNOSIS — M5432 Sciatica, left side: Secondary | ICD-10-CM

## 2013-10-18 DIAGNOSIS — IMO0002 Reserved for concepts with insufficient information to code with codable children: Secondary | ICD-10-CM | POA: Insufficient documentation

## 2013-10-18 DIAGNOSIS — M5430 Sciatica, unspecified side: Secondary | ICD-10-CM | POA: Insufficient documentation

## 2013-10-18 DIAGNOSIS — R209 Unspecified disturbances of skin sensation: Secondary | ICD-10-CM | POA: Insufficient documentation

## 2013-10-18 DIAGNOSIS — Z880 Allergy status to penicillin: Secondary | ICD-10-CM | POA: Insufficient documentation

## 2013-10-18 DIAGNOSIS — M5380 Other specified dorsopathies, site unspecified: Secondary | ICD-10-CM | POA: Insufficient documentation

## 2013-10-18 DIAGNOSIS — G8921 Chronic pain due to trauma: Secondary | ICD-10-CM | POA: Insufficient documentation

## 2013-10-18 MED ORDER — HYDROCODONE-ACETAMINOPHEN 5-325 MG PO TABS: 1.0000 | ORAL_TABLET | Freq: Four times a day (QID) | ORAL | Status: DC | PRN

## 2013-10-18 MED ORDER — PREDNISONE 20 MG PO TABS: 60.0000 mg | ORAL_TABLET | Freq: Every day | ORAL | Status: DC

## 2013-10-18 MED ORDER — PREDNISONE 20 MG PO TABS
60.0000 mg | ORAL_TABLET | Freq: Once | ORAL | Status: AC
  Administered 2013-10-18: 60 mg via ORAL
  Filled 2013-10-18: qty 3

## 2013-10-18 MED ORDER — DIAZEPAM 5 MG PO TABS: 5.0000 mg | ORAL_TABLET | Freq: Two times a day (BID) | ORAL | Status: DC

## 2013-10-18 MED ORDER — HYDROCODONE-ACETAMINOPHEN 5-325 MG PO TABS
1.0000 | ORAL_TABLET | Freq: Once | ORAL | Status: AC
  Administered 2013-10-18: 1 via ORAL
  Filled 2013-10-18: qty 1
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
## 2013-10-20 NOTE — ED Provider Notes (Signed)
Medical screening examination/treatment/procedure(s) were performed by non-physician practitioner and as supervising physician I was immediately available for consultation/collaboration.  EKG Interpretation   None         Joya Gaskins, MD 10/20/13 [phone]

## 2013-11-15 ENCOUNTER — Encounter (HOSPITAL_COMMUNITY): Payer: Self-pay | Admitting: Emergency Medicine

## 2013-11-15 ENCOUNTER — Emergency Department (HOSPITAL_COMMUNITY)
Admission: EM | Admit: 2013-11-15 | Discharge: 2013-11-15 | Disposition: A | Discharge status: Home / Self Care | Payer: No Typology Code available for payment source | Attending: Emergency Medicine | Admitting: Emergency Medicine

## 2013-11-15 ENCOUNTER — Emergency Department (HOSPITAL_COMMUNITY): Payer: No Typology Code available for payment source

## 2013-11-15 DIAGNOSIS — Z8781 Personal history of (healed) traumatic fracture: Secondary | ICD-10-CM | POA: Insufficient documentation

## 2013-11-15 DIAGNOSIS — X500XXA Overexertion from strenuous movement or load, initial encounter: Secondary | ICD-10-CM | POA: Insufficient documentation

## 2013-11-15 DIAGNOSIS — Z79899 Other long term (current) drug therapy: Secondary | ICD-10-CM | POA: Insufficient documentation

## 2013-11-15 DIAGNOSIS — Y990 Civilian activity done for income or pay: Secondary | ICD-10-CM | POA: Insufficient documentation

## 2013-11-15 DIAGNOSIS — Y9289 Other specified places as the place of occurrence of the external cause: Secondary | ICD-10-CM | POA: Insufficient documentation

## 2013-11-15 DIAGNOSIS — S46909A Unspecified injury of unspecified muscle, fascia and tendon at shoulder and upper arm level, unspecified arm, initial encounter: Secondary | ICD-10-CM | POA: Insufficient documentation

## 2013-11-15 DIAGNOSIS — S4980XA Other specified injuries of shoulder and upper arm, unspecified arm, initial encounter: Secondary | ICD-10-CM | POA: Insufficient documentation

## 2013-11-15 DIAGNOSIS — M25512 Pain in left shoulder: Secondary | ICD-10-CM

## 2013-11-15 DIAGNOSIS — Z8739 Personal history of other diseases of the musculoskeletal system and connective tissue: Secondary | ICD-10-CM | POA: Insufficient documentation

## 2013-11-15 DIAGNOSIS — G8929 Other chronic pain: Secondary | ICD-10-CM | POA: Insufficient documentation

## 2013-11-15 DIAGNOSIS — Y9389 Activity, other specified: Secondary | ICD-10-CM | POA: Insufficient documentation

## 2013-11-15 DIAGNOSIS — R209 Unspecified disturbances of skin sensation: Secondary | ICD-10-CM | POA: Insufficient documentation

## 2013-11-15 DIAGNOSIS — Z880 Allergy status to penicillin: Secondary | ICD-10-CM | POA: Insufficient documentation

## 2013-11-15 DIAGNOSIS — M549 Dorsalgia, unspecified: Secondary | ICD-10-CM | POA: Insufficient documentation

## 2013-11-15 MED ORDER — OXYCODONE-ACETAMINOPHEN 5-325 MG PO TABS
2.0000 | ORAL_TABLET | Freq: Once | ORAL | Status: AC
  Administered 2013-11-15: 2 via ORAL
  Filled 2013-11-15: qty 2

## 2013-11-15 MED ORDER — OXYCODONE-ACETAMINOPHEN 5-325 MG PO TABS: 1.0000 | ORAL_TABLET | ORAL | Status: DC | PRN

## 2013-11-15 NOTE — ED Provider Notes (Signed)
CSN: [id number]     Arrival date & time 11/15/13  1504 History   First MD Initiated Contact with Patient 11/15/13 1603     Chief Complaint  Patient presents with  . Shoulder Injury  . Shoulder Pain    HPI  Aaron [name] is a 42 y.o. male with a PMH of chronic back pain, herniated disc, rib fx, and dental fx who presents to the ED for evaluation of shoulder pain.  History was provided by the patient.  Patient states that today at 11:30 he was at work on an 8 foot ladder.  He states that he felt the ladder starting to fall and grabbed onto the building with his left hand.  He was not able to support his weight with his left arm and fell on his feet.  No head injury or LOC.  He complains of constant left shoulder pain worse with movement.  He took gabapentin with no relief.  He describes a pulling and stabbing pain.  He states he has a hx of a rotator cuff repair or "something like that" in the past.  He states he hasn't had problems with his left shoulder for "awhile."  His pain is radiating down his left arm and he is having numbness and tingling in his left hand (all fingers).  He also has having radiating pain to the left trapezius and clavicle diffusely.  No chest pain or SOB.  He has back pain at baseline with no acute changes.  He denies any other injuries.  No pain to the LE throughout.  Otherwise no other complaints including headache, dizziness, lightheadedness, nausea, emesis, abdominal pain, or other concerns.  Patient states he did not come to the ED right away because he had a court appointment at 1:30 for a seat belt ticket today that "I couldn't miss."  Patient states he continued to work through the pain, but had to go to his court appointment.      Past Medical History  Diagnosis Date  . Broken ribs   . Foot fracture, left   . Foot fracture, right   . Chronic back pain   . Herniated disc    Past Surgical History  Procedure Laterality Date  . Rotator cuff repair     History  reviewed. No pertinent family history. History  Substance Use Topics  . Smoking status: Never Smoker   . Smokeless tobacco: Not on file  . Alcohol Use: No     Comment: quit 1 year ago    Review of Systems  Constitutional: Negative for fatigue.  Eyes: Negative for photophobia and visual disturbance.  Respiratory: Negative for cough and shortness of breath.   Cardiovascular: Negative for chest pain and leg swelling.  Gastrointestinal: Negative for nausea, vomiting and abdominal pain.  Musculoskeletal: Positive for arthralgias (left shoulder) and back pain (at baseline). Negative for gait problem, joint swelling, myalgias, neck pain and neck stiffness.  Skin: Negative for color change and wound.  Neurological: Positive for numbness. Negative for dizziness, weakness, light-headedness and headaches.    Allergies  Penicillins  Home Medications   Current Outpatient Rx  Name  Route  Sig  Dispense  Refill  . diazepam (VALIUM) 5 MG tablet   Oral   Take 1 tablet (5 mg total) by mouth 2 (two) times daily.   10 tablet   0   . gabapentin (NEURONTIN) 400 MG capsule   Oral   Take 400 mg by mouth 4 (four) times daily.         Marland Kitchen  HYDROcodone-acetaminophen (NORCO/VICODIN) 5-325 MG per tablet   Oral   Take 1-2 tablets by mouth every 6 (six) hours as needed for pain.   15 tablet   0     Dispense as written.    BP 137/83  Pulse 85  Temp(Src) 98 F (36.7 C) (Oral)  Resp 20  SpO2 95%  Filed Vitals:   11/15/13 1521 11/15/13 1833  BP: 137/83 113/73  Pulse: 85 68  Temp: 98 F (36.7 C) 98.2 F (36.8 C)  TempSrc: Oral Oral  Resp: 20 12  SpO2: 95% 97%    Physical Exam  Nursing note and vitals reviewed. Constitutional: He is oriented to person, place, and time. He appears well-developed and well-nourished. No distress.  HENT:  Head: Normocephalic and atraumatic.  Right Ear: External ear normal.  Left Ear: External ear normal.  Nose: Nose normal.  Mouth/Throat: Oropharynx is  clear and moist.  Eyes: Conjunctivae are normal. Right eye exhibits no discharge. Left eye exhibits no discharge.  Neck: Normal range of motion. Neck supple.  No cervical spinal or paraspinal tenderness.  Cardiovascular: Normal rate, regular rhythm, normal heart sounds and intact distal pulses.  Exam reveals no gallop and no friction rub.   No murmur heard. Radial pulses present bilaterally  Pulmonary/Chest: Effort normal and breath sounds normal. No respiratory distress. He has no wheezes. He has no rales.   He exhibits no tenderness.    Abdominal: Soft. Bowel sounds are normal. He exhibits no distension. There is no tenderness.  Musculoskeletal: Normal range of motion. He exhibits tenderness. He exhibits no edema.  Tenderness to palpation to the left anterior and posterior shoulder diffusely.  No tenderness to palpation to the left arm throughout. Grip strength 5 out of 5 in the hands bilaterally. She able to flex and extend left elbow without any difficulties or limitations. Unable to access left shoulder range of motion due to pain. No edema, ecchymosis, or erythema to the left shoulder throughout. No tenderness to palpation to the lower extremities throughout. No thoracic or lumbar spinal tenderness.   Patient able to ambulate without difficulty or ataxia.  Neurological: He is alert and oriented to person, place, and time.  Sensation intact in the upper extremities bilaterally  Skin: Skin is warm and dry. He is not diaphoretic.    ED Course  Procedures (including critical care time) Labs Review Labs Reviewed - No data to display Imaging Review No results found.  EKG Interpretation   None      DG Shoulder Left (Final result)  Result time: 11/15/13 17:14:06    Final result by Rad Results In Interface (11/15/13 17:14:06)    Narrative:   CLINICAL DATA: Fall from ladder, heard a pop, left shoulder pain  EXAM: LEFT SHOULDER - 2+ VIEW  COMPARISON: 06/05/2012  FINDINGS: AC  joint alignment normal.  Osseous mineralization normal.  No acute fracture, dislocation, or bone destruction.  Visualized left ribs unremarkable.  IMPRESSION: Normal exam.   Electronically Signed By: Ulyses Southward M.D. On: 11/15/2013 17:14             DG Chest 2 View (Final result)  Result time: 11/15/13 17:15:51    Final result by Rad Results In Interface (11/15/13 17:15:51)    Narrative:   CLINICAL DATA: Fall from ladder, left shoulder injury  EXAM: CHEST 2 VIEW  COMPARISON: 11/20/2007  FINDINGS: Normal heart size, mediastinal contours, and pulmonary vascularity.  Lungs clear.  No pleural effusion or pneumothorax.  No acute osseous findings identified.  IMPRESSION: No radiographic evidence of acute injury.   Electronically Signed By: Ulyses Southward M.D. On: 11/15/2013 17:15         MDM   Aaron [name] is a 42 y.o. male with a PMH of chronic back pain, herniated disc, rib fx, and dental fx who presents to the ED for evaluation of shoulder pain.  Left shoulder and chest x-ray ordered.  Percocet ordered for pain.     Rechecks  6:00 PM = Patient states pain mildly improved.  No other concerns.  Informed patient of results.  He states "this is exactly the same way I felt when I tore my rotator cuff in high school playing football."  Cannot remember his previous Careers adviser.      Patient evaluated in emergency department for left shoulder pain. He had no evidence of fracture or malalignment on exam. Patient possibly has a torn rotator cuff. She has a history of a rotator cuff tear status post repair in the past, which are similar to symptoms today.  He states he cannot remember the name of his previous Careers adviser. Patient was neurovascularly intact. He was given an arm sling and a work excuse. He had no complaints of chest pain or shortness of breath throughout his ED visit. Patient was given referral to orthopedics and instructed to followup. Return precautions were  discussed. Patient obtaining ride home.   Discharge Medication List as of 11/15/2013  6:05 PM    START taking these medications   Details  oxyCODONE-acetaminophen (PERCOCET/ROXICET) 5-325 MG per tablet Take 1-2 tablets by mouth every 4 (four) hours as needed for severe pain., Starting 11/15/2013, Until Discontinued, Print         Final impressions: 1. Left shoulder pain       Greer Ee Edeline Greening PA-C          Jillyn Ledger, PA-C 11/16/13 [phone]

## 2013-11-15 NOTE — ED Notes (Addendum)
Patient states that he was on a 8 ft ladder and the ladder tipped over. Patient states he grabbed the building to hold on and felt his "shoulder snap." patient denies hitting his head and states that he landed on his feet.

## 2013-11-16 NOTE — ED Provider Notes (Signed)
Medical screening examination/treatment/procedure(s) were performed by non-physician practitioner and as supervising physician I was immediately available for consultation/collaboration.  EKG Interpretation   None        Shon Baton, MD 11/16/13 1932

## 2013-12-13 ENCOUNTER — Encounter (HOSPITAL_COMMUNITY): Payer: Self-pay | Admitting: Emergency Medicine

## 2013-12-13 ENCOUNTER — Emergency Department (HOSPITAL_COMMUNITY)
Admission: EM | Admit: 2013-12-13 | Discharge: 2013-12-13 | Disposition: A | Discharge status: Home / Self Care | Payer: No Typology Code available for payment source | Attending: Emergency Medicine | Admitting: Emergency Medicine

## 2013-12-13 DIAGNOSIS — G8929 Other chronic pain: Secondary | ICD-10-CM | POA: Insufficient documentation

## 2013-12-13 DIAGNOSIS — M546 Pain in thoracic spine: Secondary | ICD-10-CM | POA: Insufficient documentation

## 2013-12-13 DIAGNOSIS — Z8781 Personal history of (healed) traumatic fracture: Secondary | ICD-10-CM | POA: Insufficient documentation

## 2013-12-13 DIAGNOSIS — Z79899 Other long term (current) drug therapy: Secondary | ICD-10-CM | POA: Insufficient documentation

## 2013-12-13 MED ORDER — PREDNISONE 20 MG PO TABS: 40.0000 mg | ORAL_TABLET | Freq: Every day | ORAL | Status: DC

## 2013-12-13 MED ORDER — METHOCARBAMOL 500 MG PO TABS: 500.0000 mg | ORAL_TABLET | Freq: Two times a day (BID) | ORAL | Status: DC

## 2013-12-13 NOTE — ED Notes (Addendum)
Per pt sts chronic back pain that is radiating into his rib area. sts has been seen here for the same before

## 2013-12-13 NOTE — ED Provider Notes (Signed)
Medical screening examination/treatment/procedure(s) were performed by non-physician practitioner and as supervising physician I was immediately available for consultation/collaboration.  EKG Interpretation   None         Aaron [name]. Oletta Lamas, MD 12/13/13 2006

## 2013-12-13 NOTE — ED Provider Notes (Signed)
CSN: [id number]     Arrival date & time 12/13/13  1533 History  This chart was scribed for non-physician practitioner, Sharilyn Sites, PA-C working with Gavin Pound. Oletta Lamas, MD by Greggory Stallion, ED scribe. This patient was seen in room TR05C/TR05C and the patient's care was started at 5:12 PM.   Chief Complaint  Patient presents with  . Back Pain   The history is provided by the patient. No language interpreter was used.   HPI Comments: Aaron [name] is a 42 y.o. male who presents to the Emergency Department complaining of chronic back pain that has begun to radiate into his right rib area.  Pain has been present for the past several years, but worse over the past 4 days. Patient denies any recent injury, trauma, or falls. States pain is so severe he has been having to use a cane to help assist with ambulation. Patient has recently established with a new primary care physician, but first appointment is not until January. States he is desiring pain medication today to last until his followup appointment.  Denies any numbness or paresthesias of lower extremities. No loss of bowel or bladder function. Has been taking over-the-counter Aleve and Motrin without significant relief.  Past Medical History  Diagnosis Date  . Broken ribs   . Foot fracture, left   . Foot fracture, right   . Chronic back pain   . Herniated disc    Past Surgical History  Procedure Laterality Date  . Rotator cuff repair     History reviewed. No pertinent family history. History  Substance Use Topics  . Smoking status: Never Smoker   . Smokeless tobacco: Not on file  . Alcohol Use: No     Comment: quit 1 year ago    Review of Systems  Musculoskeletal: Positive for back pain.  All other systems reviewed and are negative.    Allergies  Penicillins  Home Medications   Current Outpatient Rx  Name  Route  Sig  Dispense  Refill  . diazepam (VALIUM) 5 MG tablet   Oral   Take 1 tablet (5 mg total) by mouth 2 (two)  times daily.   10 tablet   0   . gabapentin (NEURONTIN) 400 MG capsule   Oral   Take 400 mg by mouth 4 (four) times daily.         Marland Kitchen HYDROcodone-acetaminophen (NORCO/VICODIN) 5-325 MG per tablet   Oral   Take 1-2 tablets by mouth every 6 (six) hours as needed for pain.   15 tablet   0     Dispense as written.   Marland Kitchen oxyCODONE-acetaminophen (PERCOCET/ROXICET) 5-325 MG per tablet   Oral   Take 1-2 tablets by mouth every 4 (four) hours as needed for severe pain.   6 tablet   0    BP 113/74  Pulse 87  Temp(Src) 98.3 F (36.8 C) (Oral)  Resp 16  SpO2 97%  Physical Exam  Nursing note and vitals reviewed. Constitutional: He is oriented to person, place, and time. He appears well-developed and well-nourished. No distress.  HENT:  Head: Normocephalic and atraumatic.  Mouth/Throat: Oropharynx is clear and moist.  Eyes: Conjunctivae and EOM are normal. Pupils are equal, round, and reactive to light.  Neck: Normal range of motion. Neck supple.  Cardiovascular: Normal rate, regular rhythm and normal heart sounds.   Pulmonary/Chest: Effort normal and breath sounds normal. No respiratory distress. He has no wheezes.  Musculoskeletal: Normal range of motion. He exhibits no  edema.       Back:  Right thoracic paraspinal tenderness to palpation with spasm present; no midline TTP, step-off, or deformity; full ROM maintained; ambulating assisted with cane  Neurological: He is alert and oriented to person, place, and time.  Skin: Skin is warm and dry. He is not diaphoretic.  Psychiatric: He has a normal mood and affect.    ED Course  Procedures (including critical care time)  DIAGNOSTIC STUDIES: Oxygen Saturation is 97% on room air, normal by my interpretation.    COORDINATION OF CARE: 5:17 PM-Discussed treatment plan which includes pain medication in the ED with pt at bedside and pt agreed to plan.   Labs Review Labs Reviewed - No data to display Imaging Review No results  found.  EKG Interpretation   None       MDM   1. Chronic back pain    Chronic back pain.  No signs/sx concerning for cauda equina.  Rx robaxin and prednisone.  FU with PCP as previously scheduled.  Discussed plan with pt-- he acknowledge understanding and agrees with plan of care. Return precautions advised.  I personally performed the services described in this documentation, which was scribed in my presence. The recorded information has been reviewed and is accurate.   Garlon Hatchet, PA-C 12/13/13 1946

## 2013-12-14 ENCOUNTER — Emergency Department (HOSPITAL_COMMUNITY)
Admission: EM | Admit: 2013-12-14 | Discharge: 2013-12-15 | Disposition: A | Discharge status: Home / Self Care | Payer: No Typology Code available for payment source | Attending: Emergency Medicine | Admitting: Emergency Medicine

## 2013-12-14 ENCOUNTER — Encounter (HOSPITAL_COMMUNITY): Payer: Self-pay | Admitting: Emergency Medicine

## 2013-12-14 DIAGNOSIS — G8929 Other chronic pain: Secondary | ICD-10-CM | POA: Insufficient documentation

## 2013-12-14 DIAGNOSIS — M545 Low back pain, unspecified: Secondary | ICD-10-CM | POA: Insufficient documentation

## 2013-12-14 DIAGNOSIS — Z880 Allergy status to penicillin: Secondary | ICD-10-CM | POA: Insufficient documentation

## 2013-12-14 DIAGNOSIS — Z8781 Personal history of (healed) traumatic fracture: Secondary | ICD-10-CM | POA: Insufficient documentation

## 2013-12-14 DIAGNOSIS — IMO0002 Reserved for concepts with insufficient information to code with codable children: Secondary | ICD-10-CM | POA: Insufficient documentation

## 2013-12-14 DIAGNOSIS — Z79899 Other long term (current) drug therapy: Secondary | ICD-10-CM | POA: Insufficient documentation

## 2013-12-14 NOTE — ED Notes (Signed)
Pt states he did not get Robaxin or Prednisone filled that he received last night

## 2013-12-14 NOTE — ED Notes (Signed)
Pt c/o low back pain, sharp, continuous. Pt states he has been hurting "for a long time"

## 2013-12-15 MED ORDER — OXYCODONE-ACETAMINOPHEN 5-325 MG PO TABS
2.0000 | ORAL_TABLET | Freq: Once | ORAL | Status: AC
  Administered 2013-12-15: 2 via ORAL
  Filled 2013-12-15: qty 2

## 2013-12-15 MED ORDER — KETOROLAC TROMETHAMINE 60 MG/2ML IM SOLN
60.0000 mg | Freq: Once | INTRAMUSCULAR | Status: AC
  Administered 2013-12-15: 60 mg via INTRAMUSCULAR
  Filled 2013-12-15: qty 2

## 2013-12-15 NOTE — ED Provider Notes (Signed)
CSN: [id number]     Arrival date & time 12/14/13  2221 History   First MD Initiated Contact with Patient 12/15/13 0058     Chief Complaint  Patient presents with  . Back Pain   (Consider location/radiation/quality/duration/timing/severity/associated sxs/prior Treatment) HPI History provided by pt.   Pt presents w/ constant, severe, spasming, low back pain that radiates down BLE.  Chronic but worse over past week.  No associated fever, extremity weakness/paresthesias, bowel/bladder dysfunction. No recent trauma.  Per prior chart, seen for same in ED on 12/13/13 and was prescribed robaxin and prednisone.  He has not filled these prescriptions.  Prednisone does not work for him and he has never heard of robaxin before.  Has an appointment scheduled w/ new PCP on 12/31/12.  Past Medical History  Diagnosis Date  . Broken ribs   . Foot fracture, left   . Foot fracture, right   . Chronic back pain   . Herniated disc    Past Surgical History  Procedure Laterality Date  . Rotator cuff repair     No family history on file. History  Substance Use Topics  . Smoking status: Never Smoker   . Smokeless tobacco: Not on file  . Alcohol Use: No     Comment: quit 1 year ago    Review of Systems  All other systems reviewed and are negative.    Allergies  Flexeril and Penicillins  Home Medications   Current Outpatient Rx  Name  Route  Sig  Dispense  Refill  . Aspirin-Salicylamide-Caffeine (BC HEADACHE POWDER PO)   Oral   Take 1 Package by mouth 3 (three) times daily as needed (pain).         . diazepam (VALIUM) 5 MG tablet   Oral   Take 1 tablet (5 mg total) by mouth 2 (two) times daily.   10 tablet   0   . gabapentin (NEURONTIN) 300 MG capsule   Oral   Take 300 mg by mouth 4 (four) times daily. One with breakfast and lunch. Two at night         . oxyCODONE-acetaminophen (PERCOCET/ROXICET) 5-325 MG per tablet   Oral   Take 1-2 tablets by mouth every 4 (four) hours as needed  for severe pain.   6 tablet   0   . methocarbamol (ROBAXIN) 500 MG tablet   Oral   Take 1 tablet (500 mg total) by mouth 2 (two) times daily.   20 tablet   0   . predniSONE (DELTASONE) 20 MG tablet   Oral   Take 2 tablets (40 mg total) by mouth daily.   10 tablet   0    BP 120/70  Pulse 112  Temp(Src) 98.3 F (36.8 C) (Oral)  Resp 20  Wt 225 lb (102.059 kg)  SpO2 99% Physical Exam  Nursing note and vitals reviewed. Constitutional: He is oriented to person, place, and time. He appears well-developed and well-nourished.  HENT:  Head: Normocephalic and atraumatic.  Eyes:  Normal appearance  Neck: Normal range of motion.  Cardiovascular: Normal rate and regular rhythm.   Pulmonary/Chest: Effort normal and breath sounds normal.  Genitourinary:  No CVA ttp  Musculoskeletal:  Lumbar spinal and bilateral lumbar soft tissue ttp. Full active ROM of LE.  Nml patellar reflexes.  No saddle anesthesia. 5/5 hip abduction/adduction and ankle plantar/dorsiflexion strength.  Distal sensation intact.  2+ DP pulses.  Ambulates w/out diffulty.   Neurological: He is alert and oriented to person, place,  and time.  Skin: Skin is warm and dry. No rash noted.  Psychiatric: He has a normal mood and affect. His behavior is normal.    ED Course  Procedures (including critical care time) Labs Review Labs Reviewed - No data to display Imaging Review No results found.  EKG Interpretation   None       MDM   1. Chronic back pain    42yo M presents w/ acute on chronic low back pain for the second time in 2 days.  Was prescribed robaxin and prednisone at last visit but has not had them filled.  No recent trauma.  Afebrile, NAD, diffuse lumbar ttp, full ROM/nml strength/no NV deficits lower extremities, ambulatory on exam.  Will treat w/ 60mg  IM toradol and 2 percocet and then d/c home w/ referral to [facility].  No prescriptions today. He has an appt scheduled w/ new PCP on 12/31/12 as well.  Return  precautions discussed. 2:24 AM     Otilio Miu, PA-C 12/15/13 [phone]

## 2013-12-15 NOTE — ED Notes (Signed)
Pt presents with back pain, believes he may have injured it three years ago.

## 2013-12-15 NOTE — ED Provider Notes (Signed)
Medical screening examination/treatment/procedure(s) were performed by non-physician practitioner and as supervising physician I was immediately available for consultation/collaboration.   Loren Racer, MD 12/15/13 [phone]

## 2013-12-31 ENCOUNTER — Ambulatory Visit (INDEPENDENT_AMBULATORY_CARE_PROVIDER_SITE_OTHER): Payer: BC Managed Care – PPO | Admitting: Internal Medicine

## 2013-12-31 ENCOUNTER — Encounter: Payer: Self-pay | Admitting: Internal Medicine

## 2013-12-31 VITALS — BP 140/92 | HR 84 | Temp 98.1°F | Resp 18 | Wt 225.8 lb

## 2013-12-31 DIAGNOSIS — I10 Essential (primary) hypertension: Secondary | ICD-10-CM

## 2013-12-31 DIAGNOSIS — M545 Low back pain, unspecified: Secondary | ICD-10-CM

## 2013-12-31 MED ORDER — PREDNISONE 20 MG PO TABS: 20.0000 mg | ORAL_TABLET | ORAL | Status: DC

## 2013-12-31 MED ORDER — CYCLOBENZAPRINE HCL 10 MG PO TABS: ORAL_TABLET | ORAL | Status: DC

## 2013-12-31 MED ORDER — CHLORZOXAZONE 500 MG PO TABS: 500.0000 mg | ORAL_TABLET | Freq: Four times a day (QID) | ORAL | Status: DC | PRN

## 2013-12-31 MED ORDER — MELOXICAM 15 MG PO TABS: 15.0000 mg | ORAL_TABLET | Freq: Every day | ORAL | Status: DC
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
## 2013-12-31 NOTE — Patient Instructions (Addendum)
Do not get flexeril / cyclobenzaprine prescription  STOP GOODIES - ADVIL - ALEVE - IBUPROFEN  Take deltasone / Prednisone 1st  Then after finish Prednisone   Take Meloxicam for pain and inflammation    Back Pain, Adult Low back pain is very common. About 1 in 5 people have back pain.The cause of low back pain is rarely dangerous. The pain often gets better over time.About half of people with a sudden onset of back pain feel better in just 2 weeks. About 8 in 10 people feel better by 6 weeks.  CAUSES Some common causes of back pain include:  Strain of the muscles or ligaments supporting the spine.  Wear and tear (degeneration) of the spinal discs.  Arthritis.  Direct injury to the back. DIAGNOSIS Most of the time, the direct cause of low back pain is not known.However, back pain can be treated effectively even when the exact cause of the pain is unknown.Answering your caregiver's questions about your overall health and symptoms is one of the most accurate ways to make sure the cause of your pain is not dangerous. If your caregiver needs more information, he or she may order lab work or imaging tests (X-rays or MRIs).However, even if imaging tests show changes in your back, this usually does not require surgery. HOME CARE INSTRUCTIONS For many people, back pain returns.Since low back pain is rarely dangerous, it is often a condition that people can learn to [facility] their own.   Remain active. It is stressful on the back to sit or stand in one place. Do not sit, drive, or stand in one place for more than 30 minutes at a time. Take short walks on level surfaces as soon as pain allows.Try to increase the length of time you walk each day.  Do not stay in bed.Resting more than 1 or 2 days can delay your recovery.  Do not avoid exercise or work.Your body is made to move.It is not dangerous to be active, even though your back may hurt.Your back will likely heal faster if you  return to being active before your pain is gone.  Pay attention to your body when you bend and lift. Many people have less discomfortwhen lifting if they bend their knees, keep the load close to their bodies,and avoid twisting. Often, the most comfortable positions are those that put less stress on your recovering back.  Find a comfortable position to sleep. Use a firm mattress and lie on your side with your knees slightly bent. If you lie on your back, put a pillow under your knees.  Only take over-the-counter or prescription medicines as directed by your caregiver. Over-the-counter medicines to reduce pain and inflammation are often the most helpful.Your caregiver may prescribe muscle relaxant drugs.These medicines help dull your pain so you can more quickly return to your normal activities and healthy exercise.  Put ice on the injured area.  Put ice in a plastic bag.  Place a towel between your skin and the bag.  Leave the ice on for 15-20 minutes, 03-04 times a day for the first 2 to 3 days. After that, ice and heat may be alternated to reduce pain and spasms.  Ask your caregiver about trying back exercises and gentle massage. This may be of some benefit.  Avoid feeling anxious or stressed.Stress increases muscle tension and can worsen back pain.It is important to recognize when you are anxious or stressed and learn ways to manage it.Exercise is a great option. SEEK MEDICAL  CARE IF:  You have pain that is not relieved with rest or medicine.  You have pain that does not improve in 1 week.  You have new symptoms.  You are generally not feeling well. SEEK IMMEDIATE MEDICAL CARE IF:   You have pain that radiates from your back into your legs.  You develop new bowel or bladder control problems.  You have unusual weakness or numbness in your arms or legs.  You develop nausea or vomiting.  You develop abdominal pain.  You feel faint. Document Released: 12/13/2005  Document Revised: 06/13/2012 Document Reviewed: 05/03/2011 [facility] Patient Information 2014 [place]ExitCare, [place]LL[place].

## 2014-01-07 ENCOUNTER — Ambulatory Visit: Payer: Self-pay | Admitting: Internal Medicine

## 2014-01-21 ENCOUNTER — Encounter: Payer: Self-pay | Admitting: Physician Assistant

## 2014-01-21 ENCOUNTER — Ambulatory Visit (INDEPENDENT_AMBULATORY_CARE_PROVIDER_SITE_OTHER): Payer: BC Managed Care – PPO | Admitting: Physician Assistant

## 2014-01-21 VITALS — BP 118/78 | HR 80 | Temp 98.2°F | Resp 16 | Ht 71.0 in | Wt 227.0 lb

## 2014-01-21 DIAGNOSIS — R899 Unspecified abnormal finding in specimens from other organs, systems and tissues: Secondary | ICD-10-CM

## 2014-01-21 DIAGNOSIS — R740 Nonspecific elevation of levels of transaminase and lactic acid dehydrogenase [LDH]: Secondary | ICD-10-CM

## 2014-01-21 DIAGNOSIS — R7402 Elevation of levels of lactic acid dehydrogenase (LDH): Secondary | ICD-10-CM

## 2014-01-21 DIAGNOSIS — M545 Low back pain, unspecified: Secondary | ICD-10-CM

## 2014-01-21 DIAGNOSIS — I10 Essential (primary) hypertension: Secondary | ICD-10-CM

## 2014-01-21 NOTE — Progress Notes (Signed)
   Subjective:    Patient ID: Aaron [name], male    DOB: [birth year], 43 y.o.   MRN: [id number]  HPI Patient went to donate plasma and had a positive hepatitis C, however it was retested and was negative. The patient is still very distraught over this diagnosis. I have explained that no test is 100% accurate and we do get false positive results We will recheck the labs. He has never used drugs or needles, he is married and have few sexual partners, no blood transfusions or tattoos.   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 He may also start PT.   Review of Systems  Constitutional: Negative.   HENT: Negative.   Respiratory: Negative.   Cardiovascular: Negative.   Gastrointestinal: Positive for nausea and abdominal pain. Negative for diarrhea, constipation and blood in stool.  Genitourinary: Negative.   Musculoskeletal: Positive for arthralgias, back pain, gait problem and myalgias.  Skin: Negative.   Neurological: Negative.       Objective:   Physical Exam  Constitutional: He is oriented to person, place, and time. He appears well-developed and well-nourished.  HENT:  Head: Normocephalic and atraumatic.  Right Ear: External ear normal.  Left Ear: External ear normal.  Mouth/Throat: Oropharynx is clear and moist.  Eyes: Conjunctivae and EOM are normal. Pupils are equal, round, and reactive to light.  Neck: Normal range of motion. Neck supple.  Cardiovascular: Normal rate, regular rhythm and normal heart sounds.   Pulmonary/Chest: Effort normal and breath sounds normal.  Abdominal: Soft. Bowel sounds are normal.  Musculoskeletal: He exhibits tenderness.       Lumbar back: He exhibits decreased range of motion, tenderness, pain and spasm.  Neurological: He is alert and oriented to person, place, and time. No cranial nerve deficit.  Skin: Skin is warm and dry.   Psychiatric: He has a normal mood and affect. His behavior is normal.      Assessment & Plan:  Abnormal Hep C- will get chronic hep panel, CBC, BMP, LFTs- likely false positive Lower back pain- refer to pain clinic for alternative treatment and for pain medications

## 2014-01-21 NOTE — Patient Instructions (Signed)
Back Pain, Adult Low back pain is very common. About 1 in 5 people have back pain.The cause of low back pain is rarely dangerous. The pain often gets better over time.About half of people with a sudden onset of back pain feel better in just 2 weeks. About 8 in 10 people feel better by 6 weeks.  CAUSES Some common causes of back pain include:  Strain of the muscles or ligaments supporting the spine.  Wear and tear (degeneration) of the spinal discs.  Arthritis.  Direct injury to the back. DIAGNOSIS Most of the time, the direct cause of low back pain is not known.However, back pain can be treated effectively even when the exact cause of the pain is unknown.Answering your caregiver's questions about your overall health and symptoms is one of the most accurate ways to make sure the cause of your pain is not dangerous. If your caregiver needs more information, he or she may order lab work or imaging tests (X-rays or MRIs).However, even if imaging tests show changes in your back, this usually does not require surgery. HOME CARE INSTRUCTIONS For many people, back pain returns.Since low back pain is rarely dangerous, it is often a condition that people can learn to manageon their own.   Remain active. It is stressful on the back to sit or stand in one place. Do not sit, drive, or stand in one place for more than 30 minutes at a time. Take short walks on level surfaces as soon as pain allows.Try to increase the length of time you walk each day.  Do not stay in bed.Resting more than 1 or 2 days can delay your recovery.  Do not avoid exercise or work.Your body is made to move.It is not dangerous to be active, even though your back may hurt.Your back will likely heal faster if you return to being active before your pain is gone.  Pay attention to your body when you bend and lift. Many people have less discomfortwhen lifting if they bend their knees, keep the load close to their bodies,and  avoid twisting. Often, the most comfortable positions are those that put less stress on your recovering back.  Find a comfortable position to sleep. Use a firm mattress and lie on your side with your knees slightly bent. If you lie on your back, put a pillow under your knees.  Only take over-the-counter or prescription medicines as directed by your caregiver. Over-the-counter medicines to reduce pain and inflammation are often the most helpful.Your caregiver may prescribe muscle relaxant drugs.These medicines help dull your pain so you can more quickly return to your normal activities and healthy exercise.  Put ice on the injured area.  Put ice in a plastic bag.  Place a towel between your skin and the bag.  Leave the ice on for 15-20 minutes, 03-04 times a day for the first 2 to 3 days. After that, ice and heat may be alternated to reduce pain and spasms.  Ask your caregiver about trying back exercises and gentle massage. This may be of some benefit.  Avoid feeling anxious or stressed.Stress increases muscle tension and can worsen back pain.It is important to recognize when you are anxious or stressed and learn ways to manage it.Exercise is a great option. SEEK MEDICAL CARE IF:  You have pain that is not relieved with rest or medicine.  You have pain that does not improve in 1 week.  You have new symptoms.  You are generally not feeling well. SEEK   IMMEDIATE MEDICAL CARE IF:   You have pain that radiates from your back into your legs.  You develop new bowel or bladder control problems.  You have unusual weakness or numbness in your arms or legs.  You develop nausea or vomiting.  You develop abdominal pain.  You feel faint. Document Released: 12/13/2005 Document Revised: 06/13/2012 Document Reviewed: 05/03/2011 [facility]moreExitCare Patient Information 2014 [place]ExitCare, [place]LL[place].    Bad carbs also include fruit juice, alcohol, and sweet tea. These are empty calories that do not signal to  your brain that you are full.   Please remember the good carbs are still carbs which convert into sugar. So please measure them out no more than 1/2-1 cup of rice, oatmeal, pasta, and beans.  Veggies are however free foods! Pile them on.   I like lean protein at every meal such as chicken, Mal[place]urkey, pork chops, cottage cheese, etc. Just do not fry these meats and please center your meal around vegetable, the meats should be a side dish.   No all fruit is created equal. Please see the list below, the fruit at the bottom is higher in sugars than the fruit at the top

## 2014-01-22 LAB — HEPATITIS B CORE ANTIBODY, TOTAL: Hep B Core Total Ab: NONREACTIVE

## 2014-01-22 LAB — HEPATIC FUNCTION PANEL
ALT: 131 U/L — ABNORMAL HIGH (ref 0–53)
AST: 66 U/L — ABNORMAL HIGH (ref 0–37)
Albumin: 4.6 g/dL (ref 3.5–5.2)
Alkaline Phosphatase: 92 U/L (ref 39–117)
BILIRUBIN DIRECT: 0.1 mg/dL (ref 0.0–0.3)
BILIRUBIN INDIRECT: 0.5 mg/dL (ref 0.0–0.9)
Total Bilirubin: 0.6 mg/dL (ref 0.3–1.2)
Total Protein: 7.1 g/dL (ref 6.0–8.3)

## 2014-01-22 LAB — BASIC METABOLIC PANEL WITH GFR
BUN: 20 mg/dL (ref 6–23)
CO2: 26 mEq/L (ref 19–32)
Calcium: 9.5 mg/dL (ref 8.4–10.5)
Chloride: 102 mEq/L (ref 96–112)
Creat: 1.11 mg/dL (ref 0.50–1.35)
GFR, EST NON AFRICAN AMERICAN: 80 mL/min
GFR, Est African American: >89 mL/min
Glucose, Bld: 91 mg/dL (ref 70–99)
POTASSIUM: 4.3 meq/L (ref 3.5–5.3)
SODIUM: 139 meq/L (ref 135–145)

## 2014-01-22 LAB — CBC WITH DIFFERENTIAL/PLATELET
BASOS ABS: 0 10*3/uL (ref 0.0–0.1)
Basophils Relative: 1 % (ref 0–1)
EOS ABS: 0.2 10*3/uL (ref 0.0–0.7)
Eosinophils Relative: 3 % (ref 0–5)
HCT: 44.6 % (ref 39.0–52.0)
HEMOGLOBIN: 15.2 g/dL (ref 13.0–17.0)
Lymphocytes Relative: 27 % (ref 12–46)
Lymphs Abs: 1.7 10*3/uL (ref 0.7–4.0)
MCH: 30.8 pg (ref 26.0–34.0)
MCHC: 34.1 g/dL (ref 30.0–36.0)
MCV: 90.5 fL (ref 78.0–100.0)
Monocytes Absolute: 0.7 10*3/uL (ref 0.1–1.0)
Monocytes Relative: 11 % (ref 3–12)
NEUTROS ABS: 3.6 10*3/uL (ref 1.7–7.7)
NEUTROS PCT: 58 % (ref 43–77)
PLATELETS: 238 10*3/uL (ref 150–400)
RBC: 4.93 MIL/uL (ref 4.22–5.81)
RDW: 13.6 % (ref 11.5–15.5)
WBC: 6.2 10*3/uL (ref 4.0–10.5)

## 2014-01-22 LAB — TSH: TSH: 1.192 u[IU]/mL (ref 0.350–4.500)

## 2014-01-22 LAB — HEPATITIS A ANTIBODY, TOTAL: HEP A TOTAL AB: NONREACTIVE

## 2014-01-22 LAB — HEPATITIS B SURFACE ANTIBODY,QUALITATIVE: HEP B S AB: NEGATIVE

## 2014-01-22 LAB — HEPATITIS C ANTIBODY: HCV AB: REACTIVE — AB

## 2014-01-23 ENCOUNTER — Other Ambulatory Visit: Payer: Self-pay

## 2014-01-23 ENCOUNTER — Other Ambulatory Visit: Payer: Self-pay | Admitting: Physician Assistant

## 2014-01-23 DIAGNOSIS — B1920 Unspecified viral hepatitis C without hepatic coma: Secondary | ICD-10-CM

## 2014-01-23 DIAGNOSIS — R7989 Other specified abnormal findings of blood chemistry: Secondary | ICD-10-CM

## 2014-01-23 DIAGNOSIS — R945 Abnormal results of liver function studies: Principal | ICD-10-CM

## 2014-01-23 MED ORDER — HYDROCODONE-ACETAMINOPHEN 5-325 MG PO TABS: ORAL_TABLET | ORAL | Status: DC

## 2014-01-24 ENCOUNTER — Other Ambulatory Visit: Payer: BC Managed Care – PPO

## 2014-01-24 DIAGNOSIS — R945 Abnormal results of liver function studies: Principal | ICD-10-CM

## 2014-01-24 DIAGNOSIS — R7989 Other specified abnormal findings of blood chemistry: Secondary | ICD-10-CM

## 2014-01-24 DIAGNOSIS — B1920 Unspecified viral hepatitis C without hepatic coma: Secondary | ICD-10-CM

## 2014-01-24 LAB — HEPATITIS B E ANTIBODY: Hepatitis Be Antibody: NEGATIVE

## 2014-01-28 ENCOUNTER — Telehealth: Payer: Self-pay | Admitting: Physician Assistant

## 2014-01-28 LAB — HCV RNA, PCR, QUALITATIVE: HEPATITIS C VRS RNA BY PCR-QUAL: NEGATIVE

## 2014-01-28 NOTE — Telephone Encounter (Signed)
Discussed labs with his wife. She states he has been using the hydrocodone but has been vomiting the past 2 days. He states he has taken before without a reaction. Advised to bring him in for an OV.

## 2014-02-05 ENCOUNTER — Ambulatory Visit: Payer: Self-pay | Admitting: Emergency Medicine

## 2014-02-05 ENCOUNTER — Ambulatory Visit: Payer: Self-pay | Admitting: Internal Medicine

## 2014-02-11 ENCOUNTER — Telehealth: Payer: Self-pay | Admitting: Physician Assistant

## 2014-02-11 ENCOUNTER — Other Ambulatory Visit: Payer: Self-pay | Admitting: Physician Assistant

## 2014-02-11 ENCOUNTER — Other Ambulatory Visit: Payer: Self-pay

## 2014-02-11 DIAGNOSIS — R748 Abnormal levels of other serum enzymes: Secondary | ICD-10-CM

## 2014-02-11 MED ORDER — OXYCODONE HCL 5 MG PO TABS: 5.0000 mg | ORAL_TABLET | Freq: Three times a day (TID) | ORAL | Status: DC | PRN

## 2014-02-11 NOTE — Telephone Encounter (Signed)
Patient has no history of blood transfusions, denies needle use or drug history, was going to give plasma had a + Hepititis C, then a neg, here in the office he has had a + and neg RNA qualititave with LFTs elevated.  Lab Results  Component Value Date   ALT 131* 01/21/2014   AST 66* 01/21/2014   ALKPHOS 92 01/21/2014   BILITOT 0.6 01/21/2014   Patient has chronic pain, was on tylenol #3 take 9+ a day but LFTs were elevated so we put him on Norco 5/325. States he is taking 4-5 of these daily and will run out in 02/19-20th. Last filled 01/23/2014 #90, NR. He has an appointment with pain management on 02/26/14. We may call in something without tylenol to get him through until his pain management visit.

## 2014-02-12 LAB — HEPATIC FUNCTION PANEL
ALBUMIN: 4.7 g/dL (ref 3.5–5.2)
ALT: 192 U/L — ABNORMAL HIGH (ref 0–53)
AST: 69 U/L — AB (ref 0–37)
Alkaline Phosphatase: 90 U/L (ref 39–117)
BILIRUBIN INDIRECT: 0.2 mg/dL (ref 0.2–1.2)
BILIRUBIN TOTAL: 0.3 mg/dL (ref 0.2–1.2)
Bilirubin, Direct: 0.1 mg/dL (ref 0.0–0.3)
TOTAL PROTEIN: 6.9 g/dL (ref 6.0–8.3)

## 2014-02-13 LAB — HEPATITIS C RNA QUANTITATIVE

## 2014-02-18 ENCOUNTER — Telehealth: Payer: Self-pay

## 2014-02-18 NOTE — Telephone Encounter (Signed)
Received call from patient he wanted to know if he could get more pain medications because his pain clinic appointment was 5 days after he will be out of medications. Per Quentin MullingAmanda Collier, PA advised patient that she will not prescribe any more pain medications for him she states that patient can ration his meds, go to ER if needed or can go back to Tylenol #3 to last until his appointment. Patient was advised all the above.

## 2014-02-19 ENCOUNTER — Ambulatory Visit (HOSPITAL_COMMUNITY): Payer: No Typology Code available for payment source

## 2014-02-20 ENCOUNTER — Ambulatory Visit: Payer: Self-pay | Admitting: Physician Assistant

## 2014-02-27 ENCOUNTER — Other Ambulatory Visit: Payer: Self-pay

## 2014-03-08 ENCOUNTER — Other Ambulatory Visit: Payer: Self-pay | Admitting: Pain Medicine

## 2014-03-08 ENCOUNTER — Ambulatory Visit
Admission: RE | Admit: 2014-03-08 | Discharge: 2014-03-08 | Disposition: A | Discharge status: Home / Self Care | Payer: Self-pay | Source: Ambulatory Visit | Attending: Pain Medicine | Admitting: Pain Medicine

## 2014-03-08 DIAGNOSIS — M542 Cervicalgia: Secondary | ICD-10-CM

## 2014-03-20 ENCOUNTER — Encounter (HOSPITAL_COMMUNITY): Payer: Self-pay | Admitting: Emergency Medicine

## 2014-03-20 ENCOUNTER — Emergency Department (INDEPENDENT_AMBULATORY_CARE_PROVIDER_SITE_OTHER): Payer: BC Managed Care – PPO

## 2014-03-20 ENCOUNTER — Emergency Department (INDEPENDENT_AMBULATORY_CARE_PROVIDER_SITE_OTHER)
Admission: EM | Admit: 2014-03-20 | Discharge: 2014-03-20 | Disposition: A | Discharge status: Home / Self Care | Payer: BC Managed Care – PPO | Source: Home / Self Care | Attending: Family Medicine | Admitting: Family Medicine

## 2014-03-20 DIAGNOSIS — S40029A Contusion of unspecified upper arm, initial encounter: Secondary | ICD-10-CM

## 2014-03-20 MED ORDER — METHOCARBAMOL 500 MG PO TABS: 500.0000 mg | ORAL_TABLET | Freq: Three times a day (TID) | ORAL | Status: DC | PRN
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
## 2014-03-20 NOTE — Discharge Instructions (Signed)
Thank you for coming in today. Take the prednisone.  Use robaxin as needed. Followup with [facility]  Contusion A contusion is a deep bruise. Contusions are the result of an injury that caused bleeding under the skin. The contusion may turn blue, purple, or yellow. Minor injuries will give you a painless contusion, but more severe contusions may stay painful and swollen for a few weeks.  CAUSES  A contusion is usually caused by a blow, trauma, or direct force to an area of the body. SYMPTOMS   Swelling and redness of the injured area.  Bruising of the injured area.  Tenderness and soreness of the injured area.  Pain. DIAGNOSIS  The diagnosis can be made by taking a history and physical exam. An X-ray, CT scan, or MRI may be needed to determine if there were any associated injuries, such as fractures. TREATMENT  Specific treatment will depend on what area of the body was injured. In general, the best treatment for a contusion is resting, icing, elevating, and applying cold compresses to the injured area. Over-the-counter medicines may also be recommended for pain control. Ask your caregiver what the best treatment is for your contusion. HOME CARE INSTRUCTIONS   Put ice on the injured area.  Put ice in a plastic bag.  Place a towel between your skin and the bag.  Leave the ice on for 15-20 minutes, 03-04 times a day.  Only take over-the-counter or prescription medicines for pain, discomfort, or fever as directed by your caregiver. Your caregiver may recommend avoiding anti-inflammatory medicines (aspirin, ibuprofen, and naproxen) for 48 hours because these medicines may increase bruising.  Rest the injured area.  If possible, elevate the injured area to reduce swelling. SEEK IMMEDIATE MEDICAL CARE IF:   You have increased bruising or swelling.  You have pain that is getting worse.  Your swelling or pain is not relieved with medicines. MAKE SURE YOU:   Understand  these instructions.  Will watch your condition.  Will get help right away if you are not doing well or get worse. Document Released: 09/22/2005 Document Revised: 03/06/2012 Document Reviewed: 10/18/2011 [facility] IncExitCare Patient Information 2014 [place]ExitCare, [place]LLC.

## 2014-04-01 ENCOUNTER — Other Ambulatory Visit: Payer: Self-pay | Admitting: Pain Medicine

## 2014-04-01 DIAGNOSIS — M542 Cervicalgia: Secondary | ICD-10-CM

## 2014-04-05 ENCOUNTER — Ambulatory Visit
Admission: RE | Admit: 2014-04-05 | Discharge: 2014-04-05 | Disposition: A | Discharge status: Home / Self Care | Payer: BC Managed Care – PPO | Source: Ambulatory Visit | Attending: Pain Medicine | Admitting: Pain Medicine

## 2014-04-05 DIAGNOSIS — M542 Cervicalgia: Secondary | ICD-10-CM

## 2014-04-25 ENCOUNTER — Telehealth: Payer: Self-pay | Admitting: Physician Assistant

## 2014-04-25 NOTE — Telephone Encounter (Signed)
Patient called very upset about a positive cocaine test at pain management. He denies any use. I have instructed him to talk about this with pain management or follow up with a lawyer if he feels it is necessary. I also informed him about our policy, we will not fill any pain medications for him but would be happy to refer him to another pain management if he wishes. Currently he is going to follow up with [facility] on the 7th.

## 2014-05-05 ENCOUNTER — Encounter (HOSPITAL_COMMUNITY): Payer: Self-pay | Admitting: Emergency Medicine

## 2014-05-05 ENCOUNTER — Emergency Department (HOSPITAL_COMMUNITY)
Admission: EM | Admit: 2014-05-05 | Discharge: 2014-05-05 | Disposition: A | Discharge status: Home / Self Care | Payer: BC Managed Care – PPO | Attending: Emergency Medicine | Admitting: Emergency Medicine

## 2014-05-05 DIAGNOSIS — Z8781 Personal history of (healed) traumatic fracture: Secondary | ICD-10-CM | POA: Insufficient documentation

## 2014-05-05 DIAGNOSIS — G8929 Other chronic pain: Secondary | ICD-10-CM | POA: Insufficient documentation

## 2014-05-05 DIAGNOSIS — M545 Low back pain, unspecified: Secondary | ICD-10-CM | POA: Insufficient documentation

## 2014-05-05 DIAGNOSIS — Z880 Allergy status to penicillin: Secondary | ICD-10-CM | POA: Insufficient documentation

## 2014-05-05 DIAGNOSIS — M549 Dorsalgia, unspecified: Secondary | ICD-10-CM

## 2014-05-05 DIAGNOSIS — Z79899 Other long term (current) drug therapy: Secondary | ICD-10-CM | POA: Insufficient documentation

## 2014-05-05 MED ORDER — OXYCODONE HCL 5 MG PO TABS
10.0000 mg | ORAL_TABLET | Freq: Once | ORAL | Status: AC
  Administered 2014-05-05: 10 mg via ORAL
  Filled 2014-05-05: qty 2

## 2014-05-05 NOTE — ED Notes (Signed)
Pt. Stated, Back pain, I have degenerative disc disease and the pain , I just can't take it anymore.

## 2014-05-05 NOTE — ED Notes (Signed)
Has had back pain x 1 year.  Since January has had 3 MRI's.  Is currently on Prednisone and taking goody powders for pain with no relief.  Pain worsened 3am today, got up out of bed and pt hit the floor d/t legs being numb.  Pain to lower mid back.  No bowel/bladder problems.   Next appt with PCP 05-23-14.

## 2014-05-05 NOTE — ED Provider Notes (Signed)
CSN: [id number]     Arrival date & time 05/05/14  1350 History  This chart was scribed for non-physician practitioner, Antony MaduraKelly Zenia Guest, PA-C working with Shanna CiscoMegan E Docherty, MD by Greggory StallionKayla Andersen, ED scribe. This patient was seen in room TR08C/TR08C and the patient's care was started at 2:13 PM.   Chief Complaint  Patient presents with  . Back Pain   The history is provided by the patient. No language interpreter was used.   HPI Comments: Aaron [name] is a 43 y.o. male with history degenerative disc disease who presents to the Emergency Department complaining of chronic lower back pain. He has had chronic back pain for about 2 years that intermittently radiates into his legs and causes tingling. Pt has tried ice and heat with no relief. Denies new injury or trauma to his back. States his PCP had him on oxycodone for the month of April, missed his appointment for a refill due to personal reasons and states he rescheduled for 05/23/14. States he was told by his PCP to go to the ED when his pain gets bad for a short course of pain medication until he can get to his appointment. Denies fever, saddle paraesthesia, bowel or bladder incontinence. Denies history of cancer or IV drug use. Per pt, PCP is Quentin MullingAmanda Collier. Per Marchelle FolksAmanda Collier's note on 04/25/14, pt called him very upset about a positive cocaine test preformed by pain management. He told her that he had a follow up appointment with [facility] on 05/02/14. States he is still seeing Dr. Maisie Fushomas with pain management.    Past Medical History  Diagnosis Date  . Broken ribs   . Foot fracture, left   . Foot fracture, right   . Chronic back pain   . Herniated disc    Past Surgical History  Procedure Laterality Date  . Rotator cuff repair     No family history on file. History  Substance Use Topics  . Smoking status: Never Smoker   . Smokeless tobacco: Not on file  . Alcohol Use: 1.5 oz/week    3 drink(s) per week     Comment: 12 pack  per month    Review of Systems  Constitutional: Negative for fever.  Genitourinary:       Negative for bowel or bladder incontinence.  Musculoskeletal: Positive for back pain.  All other systems reviewed and are negative.  Allergies  Flexeril; Penicillins; and Vimovo  Home Medications   Prior to Admission medications   Medication Sig Start Date End Date Taking? Authorizing Provider  chlorzoxazone (PARAFON FORTE DSC) 500 MG tablet Take 1 tablet (500 mg total) by mouth 4 (four) times daily as needed for muscle spasms. 12/31/13   Lucky CowboyWilliam McKeown, MD  gabapentin (NEURONTIN) 300 MG capsule Take 300 mg by mouth 4 (four) times daily. One with breakfast and lunch. Two at night    Historical Provider, MD  HYDROcodone-acetaminophen (NORCO/VICODIN) 5-325 MG per tablet Take one tablet three times daily as needed for pain 01/23/14   Quentin MullingAmanda Collier, PA-C  methocarbamol (ROBAXIN) 500 MG tablet Take 1 tablet (500 mg total) by mouth every 8 (eight) hours as needed for muscle spasms. 03/20/14   Rodolph BongEvan S Corey, MD  oxyCODONE (ROXICODONE) 5 MG immediate release tablet Take 1 tablet (5 mg total) by mouth 3 (three) times daily as needed. 02/11/14 02/11/15  Quentin MullingAmanda Collier, PA-C   BP 124/89  Pulse 82  Temp(Src) 98.6 F (37 C) (Oral)  Resp 18  SpO2 99%  Physical Exam  Nursing note and vitals reviewed. Constitutional: He is oriented to person, place, and time. He appears well-developed and well-nourished. No distress.  HENT:  Head: Normocephalic and atraumatic.  Eyes: Conjunctivae and EOM are normal. No scleral icterus.  Neck: Normal range of motion.  Cardiovascular: Normal rate, regular rhythm and intact distal pulses.   DP and PT pulses intact bilaterally.   Pulmonary/Chest: Effort normal. No respiratory distress.  Musculoskeletal: Normal range of motion.  Mild tenderness appreciated to lumbosacral spine. Tenderness appears to be muscular in nature rather than bony. No bony deformities or step offs  palpated.   Neurological: He is alert and oriented to person, place, and time.  Patellar and achilles reflexes 2+ bilaterally. Sensation to light touch intact. Pt ambulates with normal gait.   Skin: Skin is warm and dry. No rash noted. He is not diaphoretic. No erythema. No pallor.  Psychiatric: He has a normal mood and affect. His behavior is normal.    ED Course  Procedures (including critical care time)  DIAGNOSTIC STUDIES: Oxygen Saturation is 100% on RA, normal by my interpretation.    COORDINATION OF CARE: 2:25 PM-Discussed treatment plan which includes pain medication in the ED with pt at bedside and pt agreed to plan. Advised pt that there is not much that can be done in the ED today and that he.   Labs Review Labs Reviewed - No data to display  Imaging Review No results found.   EKG Interpretation None      MDM   Final diagnoses:  Chronic back pain    Uncomplicated chronic back pain. Patient neurovascularly intact. No red flags or signs concerning for cauda equina. No gross sensory deficits appreciated. Patient states that he missed his appointment with pain management secondary to "a family tragedy". Upon review of patient's chart, his primary doctor makes note of a positive cocaine test in his pain management office that he was upset about. Patient claiming that his test was a "false positive" and they repeated it and found it to be negative. Unsure of the frustration on the part of the patient, should this be the case. Primary doctor's note also makes note of a plan for him to followup with his pain management clinic on the 7th. He states he went but "had to reschedule for the 28th".   I expressed to the patient that it is against hospital policy to manage chronic pain. Patient will be given 10 mg oxycodone in ED and discharged without no opiate prescription. He has been instructed to followup with his primary care provider or pain management clinic. Return precautions  provided and patient able to plan. He ambulated out of the ED in good condition.  I personally performed the services described in this documentation, which was scribed in my presence. The recorded information has been reviewed and is accurate.  Antony MaduraKelly Ladye Macnaughton, PA-C 05/05/14 1442

## 2014-05-05 NOTE — Discharge Instructions (Signed)

## 2014-05-06 NOTE — ED Provider Notes (Signed)
Medical screening examination/treatment/procedure(s) were performed by non-physician practitioner and as supervising physician I was immediately available for consultation/collaboration.   Shanna CiscoMegan E Malic Rosten, MD 05/06/14 [phone]

## 2014-08-04 ENCOUNTER — Encounter (HOSPITAL_COMMUNITY): Payer: Self-pay | Admitting: Emergency Medicine

## 2014-08-04 ENCOUNTER — Emergency Department (HOSPITAL_COMMUNITY)
Admission: EM | Admit: 2014-08-04 | Discharge: 2014-08-04 | Disposition: A | Discharge status: Home / Self Care | Payer: BC Managed Care – PPO | Attending: Emergency Medicine | Admitting: Emergency Medicine

## 2014-08-04 DIAGNOSIS — M549 Dorsalgia, unspecified: Secondary | ICD-10-CM | POA: Insufficient documentation

## 2014-08-04 DIAGNOSIS — M545 Low back pain, unspecified: Secondary | ICD-10-CM | POA: Insufficient documentation

## 2014-08-04 DIAGNOSIS — R61 Generalized hyperhidrosis: Secondary | ICD-10-CM | POA: Insufficient documentation

## 2014-08-04 DIAGNOSIS — Z8781 Personal history of (healed) traumatic fracture: Secondary | ICD-10-CM | POA: Insufficient documentation

## 2014-08-04 DIAGNOSIS — G8929 Other chronic pain: Secondary | ICD-10-CM | POA: Insufficient documentation

## 2014-08-04 DIAGNOSIS — Z880 Allergy status to penicillin: Secondary | ICD-10-CM | POA: Insufficient documentation

## 2014-08-04 NOTE — Discharge Instructions (Signed)
Back Injury Prevention Back injuries can be extremely painful and difficult to heal. After having one back injury, you are much more likely to experience another later on. It is important to learn how to avoid injuring or re-injuring your back. The following tips can help you to prevent a back injury. PHYSICAL FITNESS  Exercise regularly and try to develop good tone in your abdominal muscles. Your abdominal muscles provide a lot of the support needed by your back.  Do aerobic exercises (walking, jogging, biking, swimming) regularly.  Do exercises that increase balance and strength (tai chi, yoga) regularly. This can decrease your risk of falling and injuring your back.  Stretch before and after exercising.  Maintain a healthy weight. The more you weigh, the more stress is placed on your back. For every pound of weight, 10 times that amount of pressure is placed on the back. DIET  Talk to your caregiver about how much calcium and vitamin D you need per day. These nutrients help to prevent weakening of the bones (osteoporosis). Osteoporosis can cause broken (fractured) bones that lead to back pain.  Include good sources of calcium in your diet, such as dairy products, green, leafy vegetables, and products with calcium added (fortified).  Include good sources of vitamin D in your diet, such as milk and foods that are fortified with vitamin D.  Consider taking a nutritional supplement or a multivitamin if needed.  Stop smoking if you smoke. POSTURE  Sit and stand up straight. Avoid leaning forward when you sit or hunching over when you stand.  Choose chairs with good low back (lumbar) support.  If you work at a desk, sit close to your work so you do not need to lean over. Keep your chin tucked in. Keep your neck drawn back and elbows bent at a right angle. Your arms should look like the letter "L."  Sit high and close to the steering wheel when you drive. Add a lumbar support to your car  seat if needed.  Avoid sitting or standing in one position for too long. Take breaks to get up, stretch, and walk around at least once every hour. Take breaks if you are driving for long periods of time.  Sleep on your side with your knees slightly bent, or sleep on your back with a pillow under your knees. Do not sleep on your stomach. LIFTING, TWISTING, AND REACHING  Avoid heavy lifting, especially repetitive lifting. If you must do heavy lifting:  Stretch before lifting.  Work slowly.  Rest between lifts.  Use carts and dollies to move objects when possible.  Make several small trips instead of carrying 1 heavy load.  Ask for help when you need it.  Ask for help when moving big, awkward objects.  Follow these steps when lifting:  Stand with your feet shoulder-width apart.  Get as close to the object as you can. Do not try to pick up heavy objects that are far from your body.  Use handles or lifting straps if they are available.  Bend at your knees. Squat down, but keep your heels off the floor.  Keep your shoulders pulled back, your chin tucked in, and your back straight.  Lift the object slowly, tightening the muscles in your legs, abdomen, and buttocks. Keep the object as close to the center of your body as possible.  When you put a load down, use these same guidelines in reverse.  Do not:  Lift the object above your waist.  Twist at the waist while lifting or carrying a load. Move your feet if you need to turn, not your waist.  Bend over without bending at your knees.  Avoid reaching over your head, across a table, or for an object on a high surface. OTHER TIPS  Avoid wet floors and keep sidewalks clear of ice to prevent falls.  Do not sleep on a mattress that is too soft or too hard.  Keep items that are used frequently within easy reach.  Put heavier objects on shelves at waist level and lighter objects on lower or higher shelves.  Find ways to  decrease your stress, such as exercise, massage, or relaxation techniques. Stress can build up in your muscles. Tense muscles are more vulnerable to injury.  Seek treatment for depression or anxiety if needed. These conditions can increase your risk of developing back pain. SEEK MEDICAL CARE IF:  You injure your back.  You have questions about diet, exercise, or other ways to prevent back injuries. MAKE SURE YOU:  Understand these instructions.  Will watch your condition.  Will get help right away if you are not doing well or get worse. Document Released: 01/20/2005 Document Revised: 03/06/2012 Document Reviewed: 01/24/2012 [facility] Patient Information 2015 [place], [place]. This information is not intended to replace advice given to you by your health care provider. Make sure you discuss any questions you have with your health care provider.  Back Exercises Back exercises help treat and prevent back injuries. The goal is to increase your strength in your belly (abdominal) and back muscles. These exercises can also help with flexibility. Start these exercises when told by your doctor. HOME CARE Back exercises include: Pelvic Tilt.  Lie on your back with your knees bent. Tilt your pelvis until the lower part of your back is against the floor. Hold this position 5 to 10 sec. Repeat this exercise 5 to 10 times. Knee to Chest.  Pull 1 knee up against your chest and hold for 20 to 30 seconds. Repeat this with the other knee. This may be done with the other leg straight or bent, whichever feels better. Then, pull both knees up against your chest. Sit-Ups or Curl-Ups.  Bend your knees 90 degrees. Start with tilting your pelvis, and do a partial, slow sit-up. Only lift your upper half 30 to 45 degrees off the floor. Take at least 2 to 3 seonds for each sit-up. Do not do sit-ups with your knees out straight. If partial sit-ups are difficult, simply do the above but with only tightening your belly  (abdominal) muscles and holding it as told. Hip-Lift.  Lie on your back with your knees flexed 90 degrees. Push down with your feet and shoulders as you raise your hips 2 inches off the floor. Hold for 10 seconds, repeat 5 to 10 times. Back Arches.  Lie on your stomach. Prop yourself up on bent elbows. Slowly press on your hands, causing an arch in your low back. Repeat 3 to 5 times. Shoulder-Lifts.  Lie face down with arms beside your body. Keep hips and belly pressed to floor as you slowly lift your head and shoulders off the floor. Do not overdo your exercises. Be careful in the beginning. Exercises may cause you some mild back discomfort. If the pain lasts for more than 15 minutes, stop the exercises until you see your doctor. Improvement with exercise for back problems is slow.  Document Released: 01/15/2011 Document Revised: 03/06/2012 Document Reviewed: 10/14/2011 [employer] Patient Information  2015 [employer]. This information is not intended to replace advice given to you by your health care provider. Make sure you discuss any questions you have with your health care provider.

## 2014-08-04 NOTE — ED Notes (Signed)
Declined W/C at D/C and was escorted to lobby by RN. 

## 2014-08-04 NOTE — ED Provider Notes (Signed)
CSN: [id number]     Arrival date & time 08/04/14  1054 History  This chart was scribed for Junius Finner, PA-C, working with Raeford Razor, MD by Chestine Spore, ED Scribe. The patient was seen in room TR08C/TR08C at 11:34 AM.    Chief Complaint  Patient presents with  . Back Pain     The history is provided by the patient. No language interpreter was used.   HPI Comments: CANTON YEARBY is a 43 y.o. male with a medical hx of chronic back pain who presents to the Emergency Department complaining of lower back pain, worsening today. He denies any new injury to his back. He states that his Orthopedic at [facility] is closed today. He states that he is given injections and Tylenol 3. He states that he last had an injection 10 days ago but thinks that made his pain worse. He rates that pain at 10/10 initially, he states that now the pain has gone down. He states that he is having associated symptoms of diaphoresis due to lower back pain. Denies chest pain or SOB.He denies fevers, nausea, vomiting, and any other associated symptoms. He denies back surgery.   Past Medical History  Diagnosis Date  . Broken ribs   . Foot fracture, left   . Foot fracture, right   . Chronic back pain   . Herniated disc    Past Surgical History  Procedure Laterality Date  . Rotator cuff repair     History reviewed. No pertinent family history. History  Substance Use Topics  . Smoking status: Never Smoker   . Smokeless tobacco: Not on file  . Alcohol Use: 1.5 oz/week    3 drink(s) per week     Comment: 12 pack per month    Review of Systems  Constitutional: Positive for diaphoresis. Negative for fever.  Gastrointestinal: Negative for nausea and vomiting.  Musculoskeletal: Positive for back pain (lower).  All other systems reviewed and are negative.     Allergies  Flexeril; Penicillins; and Vimovo  Home Medications   Prior to Admission medications   Medication Sig Start Date End Date  Taking? Authorizing Provider  clonazePAM (KLONOPIN) 0.5 MG tablet Take 0.5 mg by mouth at bedtime as needed for anxiety.   Yes Historical Provider, MD   BP 128/90  Pulse 94  Temp(Src) 97.8 F (36.6 C) (Oral)  Resp 18  SpO2 99%  Physical Exam  Nursing note and vitals reviewed. Constitutional: He is oriented to person, place, and time. He appears well-developed and well-nourished.  Pt sitting in exam chair, NAD, non-toxic appearing.  HENT:  Head: Normocephalic and atraumatic.  Eyes: EOM are normal.  Neck: Normal range of motion.  Cardiovascular: Normal rate.   Pulmonary/Chest: Effort normal.  Musculoskeletal: Normal range of motion.  Tenderness along the L-spine and paraspinal muscles. Antalgic gait. Full ROM on all the extremities.  Neurological: He is alert and oriented to person, place, and time.  Skin: Skin is warm and dry. No rash noted. No erythema.  No erythema, rash, or ecchymosis on the back.   Psychiatric: He has a normal mood and affect. His behavior is normal.    ED Course  Procedures (including critical care time) DIAGNOSTIC STUDIES: Oxygen Saturation is 99% on room air, normal by my interpretation.    COORDINATION OF CARE: 11:40 AM-Discussed treatment plan with pt at bedside and pt agreed to plan.   Labs Review Labs Reviewed - No data to display  Imaging Review No results found.  EKG Interpretation None      MDM   Final diagnoses:  Chronic back pain    Pt presenting to ED with hx of chronic back pain, worsened today w/o known cause. [facility] and has had several injections as well as Tylenol #3.  Pt denies new injuries. States last injection 10 days ago, didn't help and may have worsened his pain.  On exam, pt appears well, non-toxic, NAD.  Vitals: WNL.  Pt is tender along lumbar spine and paraspinal muscles, normal appearing skin. No focal neuro deficit. Patient is ambulatory. No warning symptoms of back pain  including: loss of bowel control, no urinary retention, night sweats, waking from sleep with back pain, unexplained fevers or weight loss, h/o cancer, IVDU, recent trauma. No concern for cauda equina, epidural abscess, or other serious cause of back pain. Conservative measures such as rest, ice/heat and pain medicine indicated with PCP follow-up if no improvement with conservative management.   Per [place] Controlled Substance database, pt had 30 days supply of Oxycontin 10mg  and oxycodone 15mg  on 07/23/14.  When initially asked, pt had difficulty recalling who prescribed the medication, then admitted he does go to preferred pain management.  Advised he may take his home medications for pain and follow up tomorrow with pain management or orthopedist for further evaluation and treatment of his chronic pain.  Also provided pt info for Dr. Lovell SheehanJenkins, neurosurgery, if pain management is not helping with pt's pain.  Pt verbalized understanding and agreement with tx plan.     I personally performed the services described in this documentation, which was scribed in my presence. The recorded information has been reviewed and is accurate.    Junius Finnerrin O'Malley, PA-C 08/04/14 1206

## 2014-08-04 NOTE — ED Notes (Signed)
Reports lower chronic back pain, denies new injury to back. Ambulatory at triage.

## 2014-08-08 NOTE — ED Provider Notes (Signed)
Medical screening examination/treatment/procedure(s) were performed by non-physician practitioner and as supervising physician I was immediately available for consultation/collaboration.   EKG Interpretation None       Teena Mangus, MD 08/08/14 0635 

## 2014-10-30 ENCOUNTER — Emergency Department (HOSPITAL_COMMUNITY): Payer: BC Managed Care – PPO

## 2014-10-30 ENCOUNTER — Inpatient Hospital Stay (HOSPITAL_COMMUNITY)
Admission: EM | Admit: 2014-10-30 | Discharge: 2014-11-05 | DRG: 199 | Disposition: A | Discharge status: Home / Self Care | Payer: BC Managed Care – PPO | Attending: General Surgery | Admitting: General Surgery

## 2014-10-30 ENCOUNTER — Encounter (HOSPITAL_COMMUNITY): Payer: Self-pay | Admitting: Radiology

## 2014-10-30 DIAGNOSIS — S31119A Laceration without foreign body of abdominal wall, unspecified quadrant without penetration into peritoneal cavity, initial encounter: Secondary | ICD-10-CM | POA: Diagnosis present

## 2014-10-30 DIAGNOSIS — S21302A Unspecified open wound of left front wall of thorax with penetration into thoracic cavity, initial encounter: Secondary | ICD-10-CM | POA: Diagnosis present

## 2014-10-30 DIAGNOSIS — J939 Pneumothorax, unspecified: Secondary | ICD-10-CM

## 2014-10-30 DIAGNOSIS — J9811 Atelectasis: Secondary | ICD-10-CM | POA: Diagnosis not present

## 2014-10-30 DIAGNOSIS — Z9689 Presence of other specified functional implants: Secondary | ICD-10-CM

## 2014-10-30 DIAGNOSIS — S21119A Laceration without foreign body of unspecified front wall of thorax without penetration into thoracic cavity, initial encounter: Secondary | ICD-10-CM | POA: Diagnosis present

## 2014-10-30 DIAGNOSIS — T148XXA Other injury of unspecified body region, initial encounter: Secondary | ICD-10-CM

## 2014-10-30 DIAGNOSIS — S272XXA Traumatic hemopneumothorax, initial encounter: Secondary | ICD-10-CM | POA: Diagnosis present

## 2014-10-30 DIAGNOSIS — G8929 Other chronic pain: Secondary | ICD-10-CM | POA: Diagnosis present

## 2014-10-30 DIAGNOSIS — R58 Hemorrhage, not elsewhere classified: Secondary | ICD-10-CM

## 2014-10-30 DIAGNOSIS — J942 Hemothorax: Secondary | ICD-10-CM | POA: Diagnosis present

## 2014-10-30 DIAGNOSIS — R1012 Left upper quadrant pain: Secondary | ICD-10-CM

## 2014-10-30 HISTORY — DX: Dorsalgia, unspecified: M54.9

## 2014-10-30 LAB — ETHANOL: Alcohol, Ethyl (B): 217 mg/dL — ABNORMAL HIGH (ref 0–11)

## 2014-10-30 LAB — I-STAT CHEM 8, ED
BUN: 7 mg/dL (ref 6–23)
CREATININE: 1.4 mg/dL — AB (ref 0.50–1.35)
Calcium, Ion: 1.17 mmol/L (ref 1.12–1.23)
Chloride: 104 mEq/L (ref 96–112)
Glucose, Bld: 167 mg/dL — ABNORMAL HIGH (ref 70–99)
HCT: 50 % (ref 39.0–52.0)
HEMOGLOBIN: 17 g/dL (ref 13.0–17.0)
POTASSIUM: 3.3 meq/L — AB (ref 3.7–5.3)
Sodium: 144 mEq/L (ref 137–147)
TCO2: 19 mmol/L (ref 0–100)

## 2014-10-30 LAB — CBC
HEMATOCRIT: 46.1 % (ref 39.0–52.0)
HEMOGLOBIN: 15.4 g/dL (ref 13.0–17.0)
MCH: 31.3 pg (ref 26.0–34.0)
MCHC: 33.4 g/dL (ref 30.0–36.0)
MCV: 93.7 fL (ref 78.0–100.0)
Platelets: 258 10*3/uL (ref 150–400)
RBC: 4.92 MIL/uL (ref 4.22–5.81)
RDW: 12.8 % (ref 11.5–15.5)
WBC: 14.8 10*3/uL — ABNORMAL HIGH (ref 4.0–10.5)

## 2014-10-30 LAB — COMPREHENSIVE METABOLIC PANEL
ALBUMIN: 4.3 g/dL (ref 3.5–5.2)
ALK PHOS: 98 U/L (ref 39–117)
ALT: 77 U/L — ABNORMAL HIGH (ref 0–53)
AST: 44 U/L — ABNORMAL HIGH (ref 0–37)
Anion gap: 21 — ABNORMAL HIGH (ref 5–15)
BILIRUBIN TOTAL: 0.2 mg/dL — AB (ref 0.3–1.2)
BUN: 9 mg/dL (ref 6–23)
CHLORIDE: 102 meq/L (ref 96–112)
CO2: 22 meq/L (ref 19–32)
CREATININE: 1.15 mg/dL (ref 0.50–1.35)
Calcium: 9.4 mg/dL (ref 8.4–10.5)
GFR calc Af Amer: 87 mL/min — ABNORMAL LOW (ref >90)
GFR calc non Af Amer: 75 mL/min — ABNORMAL LOW (ref >90)
GLUCOSE: 168 mg/dL — AB (ref 70–99)
Potassium: 3.6 mEq/L — ABNORMAL LOW (ref 3.7–5.3)
Sodium: 145 mEq/L (ref 137–147)
Total Protein: 8.1 g/dL (ref 6.0–8.3)

## 2014-10-30 LAB — CDS SEROLOGY

## 2014-10-30 LAB — PROTIME-INR
INR: 1.06 (ref 0.00–1.49)
Prothrombin Time: 13.9 seconds (ref 11.6–15.2)

## 2014-10-30 LAB — ABO/RH: ABO/RH(D): O POS

## 2014-10-30 MED ORDER — TETANUS-DIPHTH-ACELL PERTUSSIS 5-2.5-18.5 LF-MCG/0.5 IM SUSP: 0.5000 mL | Freq: Once | INTRAMUSCULAR | Status: DC

## 2014-10-30 MED ORDER — HYDROMORPHONE HCL 1 MG/ML IJ SOLN
1.0000 mg | INTRAMUSCULAR | Status: DC | PRN
  Administered 2014-10-31 (×5): 1 mg via INTRAVENOUS
  Administered 2014-10-31: 2 mg via INTRAVENOUS
  Administered 2014-10-31 (×2): 1 mg via INTRAVENOUS
  Administered 2014-10-31: 2 mg via INTRAVENOUS
  Administered 2014-11-01 (×2): 1 mg via INTRAVENOUS
  Administered 2014-11-02 – 2014-11-03 (×3): 2 mg via INTRAVENOUS
  Administered 2014-11-03: 1 mg via INTRAVENOUS
  Administered 2014-11-03 – 2014-11-04 (×2): 2 mg via INTRAVENOUS
  Filled 2014-10-30 (×4): qty 1
  Filled 2014-10-30: qty 2
  Filled 2014-10-30 (×3): qty 1
  Filled 2014-10-30 (×2): qty 2
  Filled 2014-10-30: qty 1
  Filled 2014-10-30 (×2): qty 2
  Filled 2014-10-30: qty 1
  Filled 2014-10-30 (×3): qty 2

## 2014-10-30 MED ORDER — ONDANSETRON HCL 4 MG/2ML IJ SOLN
4.0000 mg | Freq: Four times a day (QID) | INTRAMUSCULAR | Status: DC | PRN
Start: 2014-10-30 — End: 2014-11-05

## 2014-10-30 MED ORDER — BISACODYL 10 MG RE SUPP: 10.0000 mg | Freq: Every day | RECTAL | Status: DC | PRN

## 2014-10-30 MED ORDER — ONDANSETRON HCL 4 MG/2ML IJ SOLN
INTRAMUSCULAR | Status: AC
  Administered 2014-10-30: 4 mg
  Filled 2014-10-30: qty 2

## 2014-10-30 MED ORDER — ONDANSETRON HCL 4 MG PO TABS: 4.0000 mg | ORAL_TABLET | Freq: Four times a day (QID) | ORAL | Status: DC | PRN

## 2014-10-30 MED ORDER — MORPHINE SULFATE 2 MG/ML IJ SOLN
INTRAMUSCULAR | Status: AC
  Administered 2014-10-30: 3 mg via INTRAVENOUS
  Filled 2014-10-30: qty 3

## 2014-10-30 MED ORDER — PANTOPRAZOLE SODIUM 40 MG PO TBEC
40.0000 mg | DELAYED_RELEASE_TABLET | Freq: Every day | ORAL | Status: DC
  Administered 2014-10-31: 40 mg via ORAL
  Filled 2014-10-30: qty 1

## 2014-10-30 MED ORDER — IOHEXOL 300 MG/ML  SOLN
100.0000 mL | Freq: Once | INTRAMUSCULAR | Status: AC | PRN
  Administered 2014-10-30: 100 mL via INTRAVENOUS

## 2014-10-30 MED ORDER — CIPROFLOXACIN IN D5W 400 MG/200ML IV SOLN
400.0000 mg | Freq: Once | INTRAVENOUS | Status: AC
  Administered 2014-10-30: 400 mg via INTRAVENOUS
  Filled 2014-10-30: qty 200

## 2014-10-30 MED ORDER — DOCUSATE SODIUM 100 MG PO CAPS
100.0000 mg | ORAL_CAPSULE | Freq: Two times a day (BID) | ORAL | Status: DC
Start: 2014-10-31 — End: 2014-11-05
  Administered 2014-10-31 – 2014-11-05 (×8): 100 mg via ORAL
  Filled 2014-10-30 (×10): qty 1

## 2014-10-30 MED ORDER — OXYCODONE HCL 5 MG PO TABS
10.0000 mg | ORAL_TABLET | ORAL | Status: DC | PRN
  Administered 2014-10-31: 10 mg via ORAL
  Administered 2014-10-31 – 2014-11-05 (×26): 20 mg via ORAL
  Filled 2014-10-30 (×14): qty 4
  Filled 2014-10-30: qty 2
  Filled 2014-10-30 (×11): qty 4

## 2014-10-30 MED ORDER — ENOXAPARIN SODIUM 40 MG/0.4ML ~~LOC~~ SOLN: 40.0000 mg | SUBCUTANEOUS | Status: DC

## 2014-10-30 MED ORDER — MORPHINE SULFATE 2 MG/ML IJ SOLN
INTRAMUSCULAR | Status: AC
  Administered 2014-10-30: 5 mg via INTRAVENOUS
  Filled 2014-10-30: qty 3

## 2014-10-30 MED ORDER — LIDOCAINE-EPINEPHRINE 1 %-1:100000 IJ SOLN
20.0000 mL | Freq: Once | INTRAMUSCULAR | Status: AC
  Administered 2014-10-30: 20 mL via INTRADERMAL
  Filled 2014-10-30: qty 1

## 2014-10-30 MED ORDER — MORPHINE SULFATE 2 MG/ML IJ SOLN
INTRAMUSCULAR | Status: AC
  Administered 2014-10-30: 4 mg via INTRAVENOUS
  Filled 2014-10-30: qty 2

## 2014-10-30 MED ORDER — MORPHINE SULFATE 4 MG/ML IJ SOLN: 5.0000 mg | Freq: Once | INTRAMUSCULAR | Status: DC

## 2014-10-30 MED ORDER — MORPHINE SULFATE 4 MG/ML IJ SOLN: 3.0000 mg | Freq: Once | INTRAMUSCULAR | Status: DC

## 2014-10-30 MED ORDER — PANTOPRAZOLE SODIUM 40 MG IV SOLR: 40.0000 mg | Freq: Every day | INTRAVENOUS | Status: DC

## 2014-10-30 MED ORDER — SODIUM CHLORIDE 0.9 % IV SOLN
INTRAVENOUS | Status: DC
  Administered 2014-10-31: 01:00:00 via INTRAVENOUS
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
## 2014-10-30 NOTE — Procedures (Signed)
Chest Tube Insertion Procedure Note  Indications:  Clinically significant Pneumothorax and Hemothorax  Pre-operative Diagnosis: Pneumothorax and Hemothorax  Post-operative Diagnosis: Pneumothorax and Hemothorax  Procedure Details  Informed consent was obtained for the procedure, including sedation.  Risks of lung perforation, hemorrhage, arrhythmia, and adverse drug reaction were discussed.   After sterile skin prep, using standard technique, a 32 French tube was placed in the left lateral 6 rib space.  Findings: None  Estimated Blood Loss:  Minimal         Specimens:  None              Complications:  None; patient tolerated the procedure well.         Disposition: SDU         Condition: stable  Attending Attestation: I performed the procedure.

## 2014-10-30 NOTE — ED Notes (Signed)
I stat chem 8 shown to Dr. Lindie SpruceWyatt by B. Bing PlumeHaynes, EMT
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
## 2014-10-31 ENCOUNTER — Inpatient Hospital Stay (HOSPITAL_COMMUNITY): Payer: BC Managed Care – PPO

## 2014-10-31 LAB — CBC
HEMATOCRIT: 42.5 % (ref 39.0–52.0)
HEMOGLOBIN: 14.3 g/dL (ref 13.0–17.0)
MCH: 31.2 pg (ref 26.0–34.0)
MCHC: 33.6 g/dL (ref 30.0–36.0)
MCV: 92.8 fL (ref 78.0–100.0)
Platelets: 191 10*3/uL (ref 150–400)
RBC: 4.58 MIL/uL (ref 4.22–5.81)
RDW: 13 % (ref 11.5–15.5)
WBC: 8.8 10*3/uL (ref 4.0–10.5)

## 2014-10-31 LAB — TYPE AND SCREEN
ABO/RH(D): O POS
Antibody Screen: NEGATIVE
Unit division: 0
Unit division: 0

## 2014-10-31 LAB — PREPARE FRESH FROZEN PLASMA
UNIT DIVISION: 0
UNIT DIVISION: 0

## 2014-10-31 LAB — BASIC METABOLIC PANEL
ANION GAP: 14 (ref 5–15)
BUN: 7 mg/dL (ref 6–23)
CO2: 23 mEq/L (ref 19–32)
Calcium: 8.6 mg/dL (ref 8.4–10.5)
Chloride: 105 mEq/L (ref 96–112)
Creatinine, Ser: 0.97 mg/dL (ref 0.50–1.35)
GFR calc Af Amer: >90 mL/min (ref >90)
GFR calc non Af Amer: >90 mL/min (ref >90)
Glucose, Bld: 153 mg/dL — ABNORMAL HIGH (ref 70–99)
Potassium: 4.1 mEq/L (ref 3.7–5.3)
Sodium: 142 mEq/L (ref 137–147)

## 2014-10-31 LAB — MRSA PCR SCREENING: MRSA by PCR: NEGATIVE

## 2014-10-31 MED ORDER — POLYETHYLENE GLYCOL 3350 17 G PO PACK
17.0000 g | PACK | Freq: Every day | ORAL | Status: DC
  Administered 2014-11-04: 17 g via ORAL
  Filled 2014-10-31 (×6): qty 1

## 2014-10-31 MED ORDER — ENOXAPARIN SODIUM 40 MG/0.4ML ~~LOC~~ SOLN
40.0000 mg | Freq: Every day | SUBCUTANEOUS | Status: DC
  Administered 2014-11-01 – 2014-11-05 (×5): 40 mg via SUBCUTANEOUS
  Filled 2014-10-31 (×5): qty 0.4

## 2014-10-31 MED ORDER — OXYCODONE HCL ER 10 MG PO T12A
10.0000 mg | EXTENDED_RELEASE_TABLET | Freq: Two times a day (BID) | ORAL | Status: DC
  Administered 2014-10-31 – 2014-11-05 (×9): 10 mg via ORAL
  Filled 2014-10-31 (×10): qty 1

## 2014-10-31 NOTE — ED Notes (Signed)
Lovenox and colace will not be given until the AM per pharmacy

## 2014-10-31 NOTE — Progress Notes (Signed)
UR completed.  Sylvanus Telford, RN BSN MHA CCM Trauma/Neuro ICU Case Manager [phone]  
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
## 2014-10-31 NOTE — Plan of Care (Signed)
Problem: Phase I Progression Outcomes Goal: Hemodynamically stable Outcome: Completed/Met Date Met:  10/31/14     

## 2014-10-31 NOTE — ED Notes (Signed)
This RN ordered a clear liquids tray for the pt.

## 2014-10-31 NOTE — ED Notes (Signed)
Pt's O2 sats dropped to 88%. This RN went into room and found that the pt's nasal cannula was not in his nose. This RN replaced the nasal cannula, and asked pt to take deep breaths in through his nose. Pt stated that he couldn't because of the pain. This RN increased the pt's O2 to 3L. Pt's O2 sats are now 97%.

## 2014-10-31 NOTE — ED Notes (Signed)
Wife Silverio LayMiranda Siever [phone]  Please call if any changes occur

## 2014-11-01 ENCOUNTER — Inpatient Hospital Stay (HOSPITAL_COMMUNITY): Payer: BC Managed Care – PPO

## 2014-11-01 DIAGNOSIS — S21119A Laceration without foreign body of unspecified front wall of thorax without penetration into thoracic cavity, initial encounter: Secondary | ICD-10-CM | POA: Diagnosis present

## 2014-11-01 DIAGNOSIS — G8929 Other chronic pain: Secondary | ICD-10-CM | POA: Diagnosis present

## 2014-11-01 DIAGNOSIS — S31119A Laceration without foreign body of abdominal wall, unspecified quadrant without penetration into peritoneal cavity, initial encounter: Secondary | ICD-10-CM | POA: Diagnosis present
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
## 2014-11-01 NOTE — Progress Notes (Signed)
[place] to [place], Charity fundraiserN on [id number].

## 2014-11-01 NOTE — Plan of Care (Signed)
Problem: Phase I Progression Outcomes Goal: Progress activity as tolerated unless otherwise ordered Outcome: Progressing     

## 2014-11-02 ENCOUNTER — Inpatient Hospital Stay (HOSPITAL_COMMUNITY): Payer: BC Managed Care – PPO

## 2014-11-02 NOTE — Progress Notes (Signed)
Chest tube drainage was 3710mL/24 hr so we discontinued it.  Will repeat CXR tomorrow to check for stability and plan on d/c home tomorrow if stable.  Placed Vaseline gauze dressing and gauze covered with tape, left suture in place on the skin.  He will need suture removal in 1 week  Aris GeorgiaMegan Dort, PA-C [facility] [phone] @T @

## 2014-11-02 NOTE — Plan of Care (Signed)
Problem: Phase I Progression Outcomes Goal: O2 sats > or equal 90% or at baseline Outcome: Progressing     

## 2014-11-02 NOTE — Plan of Care (Signed)
Problem: Phase I Progression Outcomes Goal: Dyspnea controlled at rest Outcome: Completed/Met Date Met:  11/02/14

## 2014-11-02 NOTE — Plan of Care (Signed)
Problem: Phase I Progression Outcomes Goal: Pain controlled Outcome: Not Progressing     

## 2014-11-02 NOTE — Progress Notes (Signed)
Took 10AM Oxycontin in to room- refused. States he wants to take his Oxycodone instead. Oxycontin wasted and witnessed- see Pyxis. Trina Aoarla Bernardina Cacho, RN
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
## 2014-11-03 ENCOUNTER — Inpatient Hospital Stay (HOSPITAL_COMMUNITY): Payer: BC Managed Care – PPO

## 2014-11-03 ENCOUNTER — Encounter (HOSPITAL_COMMUNITY): Payer: Self-pay | Admitting: Radiology

## 2014-11-03 MED ORDER — IOHEXOL 300 MG/ML  SOLN
25.0000 mL | INTRAMUSCULAR | Status: AC
  Administered 2014-11-03 (×2): 25 mL via ORAL

## 2014-11-03 MED ORDER — IOHEXOL 300 MG/ML  SOLN
100.0000 mL | Freq: Once | INTRAMUSCULAR | Status: AC | PRN
  Administered 2014-11-03: 100 mL via INTRAVENOUS
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
## 2014-11-04 ENCOUNTER — Inpatient Hospital Stay (HOSPITAL_COMMUNITY): Payer: BC Managed Care – PPO

## 2014-11-04 MED ORDER — METHOCARBAMOL 500 MG PO TABS
1000.0000 mg | ORAL_TABLET | Freq: Three times a day (TID) | ORAL | Status: DC
  Administered 2014-11-04 – 2014-11-05 (×4): 1000 mg via ORAL
  Filled 2014-11-04 (×7): qty 2

## 2014-11-04 MED ORDER — ACETAMINOPHEN 325 MG PO TABS: 650.0000 mg | ORAL_TABLET | Freq: Four times a day (QID) | ORAL | Status: DC

## 2014-11-04 MED ORDER — OXYCODONE HCL 10 MG PO TABS: 10.0000 mg | ORAL_TABLET | Freq: Four times a day (QID) | ORAL | Status: DC | PRN

## 2014-11-04 MED ORDER — DSS 100 MG PO CAPS: 100.0000 mg | ORAL_CAPSULE | Freq: Two times a day (BID) | ORAL | Status: DC

## 2014-11-04 MED ORDER — BISACODYL 10 MG RE SUPP
10.0000 mg | Freq: Every day | RECTAL | Status: DC
  Administered 2014-11-04: 10 mg via RECTAL
  Filled 2014-11-04: qty 1

## 2014-11-04 MED ORDER — BISACODYL 10 MG RE SUPP: 10.0000 mg | Freq: Every day | RECTAL | Status: DC | PRN

## 2014-11-04 MED ORDER — POLYETHYLENE GLYCOL 3350 17 G PO PACK: 17.0000 g | PACK | Freq: Every day | ORAL | Status: DC | PRN

## 2014-11-04 MED ORDER — ACETAMINOPHEN 325 MG PO TABS
650.0000 mg | ORAL_TABLET | Freq: Four times a day (QID) | ORAL | Status: DC
  Administered 2014-11-04 – 2014-11-05 (×5): 650 mg via ORAL
  Filled 2014-11-04 (×4): qty 2

## 2014-11-04 MED ORDER — METHOCARBAMOL 500 MG PO TABS: 1000.0000 mg | ORAL_TABLET | Freq: Three times a day (TID) | ORAL | Status: DC
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
## 2014-11-08 ENCOUNTER — Emergency Department (HOSPITAL_COMMUNITY): Payer: BC Managed Care – PPO

## 2014-11-08 ENCOUNTER — Encounter (HOSPITAL_COMMUNITY): Payer: Self-pay

## 2014-11-08 ENCOUNTER — Encounter: Payer: Self-pay | Admitting: Physician Assistant

## 2014-11-08 ENCOUNTER — Emergency Department (HOSPITAL_COMMUNITY)
Admission: EM | Admit: 2014-11-08 | Discharge: 2014-11-08 | Disposition: A | Discharge status: Home / Self Care | Payer: BC Managed Care – PPO | Attending: Emergency Medicine | Admitting: Emergency Medicine

## 2014-11-08 ENCOUNTER — Ambulatory Visit (INDEPENDENT_AMBULATORY_CARE_PROVIDER_SITE_OTHER): Payer: BC Managed Care – PPO | Admitting: Physician Assistant

## 2014-11-08 VITALS — BP 110/70 | HR 88 | Temp 97.7°F | Resp 16 | Ht 71.0 in | Wt 225.0 lb

## 2014-11-08 DIAGNOSIS — R0602 Shortness of breath: Secondary | ICD-10-CM | POA: Insufficient documentation

## 2014-11-08 DIAGNOSIS — G8929 Other chronic pain: Secondary | ICD-10-CM | POA: Diagnosis not present

## 2014-11-08 DIAGNOSIS — R079 Chest pain, unspecified: Secondary | ICD-10-CM

## 2014-11-08 DIAGNOSIS — R0789 Other chest pain: Secondary | ICD-10-CM | POA: Diagnosis not present

## 2014-11-08 DIAGNOSIS — Z880 Allergy status to penicillin: Secondary | ICD-10-CM | POA: Insufficient documentation

## 2014-11-08 DIAGNOSIS — R0689 Other abnormalities of breathing: Secondary | ICD-10-CM

## 2014-11-08 DIAGNOSIS — Z79899 Other long term (current) drug therapy: Secondary | ICD-10-CM | POA: Diagnosis not present

## 2014-11-08 DIAGNOSIS — R0989 Other specified symptoms and signs involving the circulatory and respiratory systems: Secondary | ICD-10-CM

## 2014-11-08 DIAGNOSIS — Z8781 Personal history of (healed) traumatic fracture: Secondary | ICD-10-CM | POA: Insufficient documentation

## 2014-11-08 DIAGNOSIS — F411 Generalized anxiety disorder: Secondary | ICD-10-CM

## 2014-11-08 LAB — I-STAT TROPONIN, ED: TROPONIN I, POC: 0 ng/mL (ref 0.00–0.08)

## 2014-11-08 LAB — BASIC METABOLIC PANEL
Anion gap: 12 (ref 5–15)
BUN: 7 mg/dL (ref 6–23)
CALCIUM: 9.5 mg/dL (ref 8.4–10.5)
CO2: 25 mEq/L (ref 19–32)
Chloride: 99 mEq/L (ref 96–112)
Creatinine, Ser: 0.97 mg/dL (ref 0.50–1.35)
GFR calc Af Amer: >90 mL/min (ref >90)
Glucose, Bld: 141 mg/dL — ABNORMAL HIGH (ref 70–99)
Potassium: 3.9 mEq/L (ref 3.7–5.3)
Sodium: 136 mEq/L — ABNORMAL LOW (ref 137–147)

## 2014-11-08 LAB — CBC
HCT: 39.7 % (ref 39.0–52.0)
Hemoglobin: 13.8 g/dL (ref 13.0–17.0)
MCH: 31.6 pg (ref 26.0–34.0)
MCHC: 34.8 g/dL (ref 30.0–36.0)
MCV: 90.8 fL (ref 78.0–100.0)
Platelets: 274 10*3/uL (ref 150–400)
RBC: 4.37 MIL/uL (ref 4.22–5.81)
RDW: 12.5 % (ref 11.5–15.5)
WBC: 9.4 10*3/uL (ref 4.0–10.5)

## 2014-11-08 MED ORDER — CLONAZEPAM 0.5 MG PO TABS: 0.5000 mg | ORAL_TABLET | Freq: Every evening | ORAL | Status: DC | PRN

## 2014-11-08 MED ORDER — MORPHINE SULFATE 4 MG/ML IJ SOLN: 4.0000 mg | Freq: Once | INTRAMUSCULAR | Status: DC

## 2014-11-08 MED ORDER — KETOROLAC TROMETHAMINE 30 MG/ML IJ SOLN
30.0000 mg | Freq: Once | INTRAMUSCULAR | Status: AC
  Administered 2014-11-08: 30 mg via INTRAVENOUS
  Filled 2014-11-08: qty 1

## 2014-11-08 MED ORDER — DIAZEPAM 5 MG/ML IJ SOLN
5.0000 mg | Freq: Once | INTRAMUSCULAR | Status: AC
  Administered 2014-11-08: 5 mg via INTRAVENOUS
  Filled 2014-11-08: qty 2
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
## 2014-11-08 NOTE — ED Notes (Signed)
Patient walked well oxygen level stayed at 95 room air

## 2014-11-08 NOTE — Discharge Instructions (Signed)
Be sure to use your incentive spirometer multiple times a day to help prevent the development of pneumonia.  One way to perform your breathing exercises is during commercial breaks while watching television.  If you develop fever, vomiting, productive cough, or other worsening symptoms, seek further evaluation by a medical professional as these may be signs of onset of pneumonia.  Be sure to take your prescribed pain medication from recent hospitalization as prescribed. See below for further instructions.
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
## 2014-11-08 NOTE — Patient Instructions (Signed)

## 2014-11-20 ENCOUNTER — Encounter (HOSPITAL_COMMUNITY): Payer: Self-pay

## 2014-12-07 ENCOUNTER — Encounter (HOSPITAL_COMMUNITY): Payer: Self-pay | Admitting: Emergency Medicine

## 2014-12-07 DIAGNOSIS — Z880 Allergy status to penicillin: Secondary | ICD-10-CM | POA: Diagnosis not present

## 2014-12-07 DIAGNOSIS — R0789 Other chest pain: Secondary | ICD-10-CM | POA: Diagnosis not present

## 2014-12-07 DIAGNOSIS — R110 Nausea: Secondary | ICD-10-CM | POA: Diagnosis not present

## 2014-12-07 DIAGNOSIS — M549 Dorsalgia, unspecified: Secondary | ICD-10-CM | POA: Diagnosis present

## 2014-12-07 DIAGNOSIS — R0602 Shortness of breath: Secondary | ICD-10-CM | POA: Insufficient documentation

## 2014-12-07 DIAGNOSIS — Z79899 Other long term (current) drug therapy: Secondary | ICD-10-CM | POA: Insufficient documentation

## 2014-12-07 DIAGNOSIS — G8929 Other chronic pain: Secondary | ICD-10-CM | POA: Insufficient documentation

## 2014-12-07 DIAGNOSIS — Z8781 Personal history of (healed) traumatic fracture: Secondary | ICD-10-CM | POA: Insufficient documentation
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
## 2014-12-08 ENCOUNTER — Emergency Department (HOSPITAL_COMMUNITY): Payer: BC Managed Care – PPO

## 2014-12-08 ENCOUNTER — Emergency Department (HOSPITAL_COMMUNITY)
Admission: EM | Admit: 2014-12-08 | Discharge: 2014-12-08 | Disposition: A | Discharge status: Home / Self Care | Payer: BC Managed Care – PPO | Attending: Emergency Medicine | Admitting: Emergency Medicine

## 2014-12-08 ENCOUNTER — Encounter (HOSPITAL_COMMUNITY): Payer: Self-pay

## 2014-12-08 DIAGNOSIS — M549 Dorsalgia, unspecified: Secondary | ICD-10-CM

## 2014-12-08 DIAGNOSIS — R0781 Pleurodynia: Secondary | ICD-10-CM

## 2014-12-08 LAB — CBC WITH DIFFERENTIAL/PLATELET
BASOS PCT: 0 % (ref 0–1)
Basophils Absolute: 0 10*3/uL (ref 0.0–0.1)
EOS ABS: 0.1 10*3/uL (ref 0.0–0.7)
Eosinophils Relative: 2 % (ref 0–5)
HCT: 43.7 % (ref 39.0–52.0)
HEMOGLOBIN: 15.1 g/dL (ref 13.0–17.0)
LYMPHS ABS: 3.3 10*3/uL (ref 0.7–4.0)
Lymphocytes Relative: 42 % (ref 12–46)
MCH: 31.5 pg (ref 26.0–34.0)
MCHC: 34.6 g/dL (ref 30.0–36.0)
MCV: 91.2 fL (ref 78.0–100.0)
MONOS PCT: 6 % (ref 3–12)
Monocytes Absolute: 0.5 10*3/uL (ref 0.1–1.0)
NEUTROS PCT: 50 % (ref 43–77)
Neutro Abs: 4 10*3/uL (ref 1.7–7.7)
PLATELETS: 183 10*3/uL (ref 150–400)
RBC: 4.79 MIL/uL (ref 4.22–5.81)
RDW: 12.3 % (ref 11.5–15.5)
WBC: 8 10*3/uL (ref 4.0–10.5)

## 2014-12-08 LAB — BASIC METABOLIC PANEL
ANION GAP: 14 (ref 5–15)
BUN: 10 mg/dL (ref 6–23)
CHLORIDE: 101 meq/L (ref 96–112)
CO2: 26 mEq/L (ref 19–32)
Calcium: 9.7 mg/dL (ref 8.4–10.5)
Creatinine, Ser: 0.99 mg/dL (ref 0.50–1.35)
GFR calc Af Amer: >90 mL/min (ref >90)
GFR calc non Af Amer: >90 mL/min (ref >90)
Glucose, Bld: 95 mg/dL (ref 70–99)
POTASSIUM: 3.6 meq/L — AB (ref 3.7–5.3)
SODIUM: 141 meq/L (ref 137–147)

## 2014-12-08 MED ORDER — KETOROLAC TROMETHAMINE 30 MG/ML IJ SOLN
30.0000 mg | Freq: Once | INTRAMUSCULAR | Status: AC
  Administered 2014-12-08: 30 mg via INTRAVENOUS
  Filled 2014-12-08: qty 1

## 2014-12-08 MED ORDER — IOHEXOL 350 MG/ML SOLN
100.0000 mL | Freq: Once | INTRAVENOUS | Status: AC | PRN
  Administered 2014-12-08: 100 mL via INTRAVENOUS

## 2014-12-08 MED ORDER — OXYCODONE HCL 10 MG PO TABS: 10.0000 mg | ORAL_TABLET | Freq: Four times a day (QID) | ORAL | Status: DC | PRN

## 2014-12-08 NOTE — Discharge Instructions (Signed)
Talk with your pain management clinic before getting the prescription filled because some pain clinics well drop you from their practice if you get narcotics filled from a source other than them.  Chest Pain (Nonspecific) It is often hard to give a specific diagnosis for the cause of chest pain. There is always a chance that your pain could be related to something serious, such as a heart attack or a blood clot in the lungs. You need to follow up with your health care provider for further evaluation. CAUSES   Heartburn.  Pneumonia or bronchitis.  Anxiety or stress.  Inflammation around your heart (pericarditis) or lung (pleuritis or pleurisy).  A blood clot in the lung.  A collapsed lung (pneumothorax). It can develop suddenly on its own (spontaneous pneumothorax) or from trauma to the chest.  Shingles infection (herpes zoster virus). The chest wall is composed of bones, muscles, and cartilage. Any of these can be the source of the pain.  The bones can be bruised by injury.  The muscles or cartilage can be strained by coughing or overwork.  The cartilage can be affected by inflammation and become sore (costochondritis). DIAGNOSIS  Lab tests or other studies may be needed to find the cause of your pain. Your health care provider may have you take a test called an ambulatory electrocardiogram (ECG). An ECG records your heartbeat patterns over a 24-hour period. You may also have other tests, such as:  Transthoracic echocardiogram (TTE). During echocardiography, sound waves are used to evaluate how blood flows through your heart.  Transesophageal echocardiogram (TEE).  Cardiac monitoring. This allows your health care provider to monitor your heart rate and rhythm in real time.  Holter monitor. This is a portable device that records your heartbeat and can help diagnose heart arrhythmias. It allows your health care provider to track your heart activity for several days, if  needed.  Stress tests by exercise or by giving medicine that makes the heart beat faster. TREATMENT   Treatment depends on what may be causing your chest pain. Treatment may include:  Acid blockers for heartburn.  Anti-inflammatory medicine.  Pain medicine for inflammatory conditions.  Antibiotics if an infection is present.  You may be advised to change lifestyle habits. This includes stopping smoking and avoiding alcohol, caffeine, and chocolate.  You may be advised to keep your head raised (elevated) when sleeping. This reduces the chance of acid going backward from your stomach into your esophagus. Most of the time, nonspecific chest pain will improve within 2-3 days with rest and mild pain medicine.  HOME CARE INSTRUCTIONS   If antibiotics were prescribed, take them as directed. Finish them even if you start to feel better.  For the next few days, avoid physical activities that bring on chest pain. Continue physical activities as directed.  Do not use any tobacco products, including cigarettes, chewing tobacco, or electronic cigarettes.  Avoid drinking alcohol.  Only take medicine as directed by your health care provider.  Follow your health care provider's suggestions for further testing if your chest pain does not go away.  Keep any follow-up appointments you made. If you do not go to an appointment, you could develop lasting (chronic) problems with pain. If there is any problem keeping an appointment, call to reschedule. SEEK MEDICAL CARE IF:   Your chest pain does not go away, even after treatment.  You have a rash with blisters on your chest.  You have a fever. SEEK IMMEDIATE MEDICAL CARE IF:  You have increased chest pain or pain that spreads to your arm, neck, jaw, back, or abdomen.  You have shortness of breath.  You have an increasing cough, or you cough up blood.  You have severe back or abdominal pain.  You feel nauseous or vomit.  You have severe  weakness.  You faint.  You have chills. This is an emergency. Do not wait to see if the pain will go away. Get medical help at once. Call your local emergency services (911 in [place] Do not drive yourself to the hospital. MAKE SURE YOU:   Understand these instructions.  Will watch your condition.  Will get help right away if you are not doing well or get worse. Document Released: 09/22/2005 Document Revised: 12/18/2013 Document Reviewed: 07/18/2008 [facility] Patient Information 2015 [employer] [place]. This information is not intended to replace advice given to you by your health care provider. Make sure you discuss any questions you have with your health care provider.  Oxycodone tablets or capsules What is this medicine? OXYCODONE (ox i KOE done) is a pain reliever. It is used to treat moderate to severe pain. This medicine may be used for other purposes; ask your health care provider or pharmacist if you have questions. COMMON BRAND NAME(S): Dazidox, Endocodone, OXECTA, OxyIR, Percolone, Roxicodone What should I tell my health care provider before I take this medicine? They need to know if you have any of these conditions: -Addison's disease -brain tumor -drug abuse or addiction -head injury -heart disease -if you frequently drink alcohol containing drinks -kidney disease or problems going to the bathroom -liver disease -lung disease, asthma, or breathing problems -mental problems -an unusual or allergic reaction to oxycodone, codeine, hydrocodone, morphine, other medicines, foods, dyes, or preservatives -pregnant or trying to get pregnant -breast-feeding How should I use this medicine? Take this medicine by mouth with a glass of water. Follow the directions on the prescription label. You can take it with or without food. If it upsets your stomach, take it with food. Take your medicine at regular intervals. Do not take it more often than directed. Do not stop taking except on your  doctor's advice. Some brands of this medicine, like Oxecta, have special instructions. Ask your doctor or pharmacist if these directions are for you: Do not cut, crush or chew this medicine. Swallow only one tablet at a time. Do not wet, soak, or lick the tablet before you take it. Talk to your pediatrician regarding the use of this medicine in children. Special care may be needed. Overdosage: If you think you have taken too much of this medicine contact a poison control center or emergency room at once. NOTE: This medicine is only for you. Do not share this medicine with others. What if I miss a dose? If you miss a dose, take it as soon as you can. If it is almost time for your next dose, take only that dose. Do not take double or extra doses. What may interact with this medicine? -alcohol -antihistamines -certain medicines used for nausea like chlorpromazine, droperidol -erythromycin -ketoconazole -medicines for depression, anxiety, or psychotic disturbances -medicines for sleep -muscle relaxants -naloxone -naltrexone -narcotic medicines (opiates) for pain -nilotinib -phenobarbital -phenytoin -rifampin -ritonavir -voriconazole This list may not describe all possible interactions. Give your health care provider a list of all the medicines, herbs, non-prescription drugs, or dietary supplements you use. Also tell them if you smoke, drink alcohol, or use illegal drugs. Some items may interact with your medicine. What  should I watch for while using this medicine? Tell your doctor or health care professional if your pain does not go away, if it gets worse, or if you have new or a different type of pain. You may develop tolerance to the medicine. Tolerance means that you will need a higher dose of the medicine for pain relief. Tolerance is normal and is expected if you take this medicine for a long time. Do not suddenly stop taking your medicine because you may develop a severe reaction. Your  body becomes used to the medicine. This does NOT mean you are addicted. Addiction is a behavior related to getting and using a drug for a non-medical reason. If you have pain, you have a medical reason to take pain medicine. Your doctor will tell you how much medicine to take. If your doctor wants you to stop the medicine, the dose will be slowly lowered over time to avoid any side effects. You may get drowsy or dizzy when you first start taking this medicine or change doses. Do not drive, use machinery, or do anything that may be dangerous until you know how the medicine affects you. Stand or sit up slowly. There are different types of narcotic medicines (opiates) for pain. If you take more than one type at the same time, you may have more side effects. Give your health care provider a list of all medicines you use. Your doctor will tell you how much medicine to take. Do not take more medicine than directed. Call emergency for help if you have problems breathing. This medicine will cause constipation. Try to have a bowel movement at least every 2 to 3 days. If you do not have a bowel movement for 3 days, call your doctor or health care professional. Your mouth may get dry. Drinking water, chewing sugarless gum, or sucking on hard candy may help. See your dentist every 6 months. What side effects may I notice from receiving this medicine? Side effects that you should report to your doctor or health care professional as soon as possible: -allergic reactions like skin rash, itching or hives, swelling of the face, lips, or tongue -breathing problems -confusion -feeling faint or lightheaded, falls -trouble passing urine or change in the amount of urine -unusually weak or tired Side effects that usually do not require medical attention (report to your doctor or health care professional if they continue or are bothersome): -constipation -dry mouth -itching -nausea, vomiting -upset stomach This list may  not describe all possible side effects. Call your doctor for medical advice about side effects. You may report side effects to FDA at 1-800-FDA-1088. Where should I keep my medicine? Keep out of the reach of children. This medicine can be abused. Keep your medicine in a safe place to protect it from theft. Do not share this medicine with anyone. Selling or giving away this medicine is dangerous and against the law. Store at room temperature between 15 and 30 degrees C (59 and 86 degrees F). Protect from light. Keep container tightly closed. This medicine may cause accidental overdose and death if it is taken by other adults, children, or pets. Flush any unused medicine down the toilet to reduce the chance of harm. Do not use the medicine after the expiration date. NOTE: This sheet is a summary. It may not cover all possible information. If you have questions about this medicine, talk to your doctor, pharmacist, or health care provider.  2015, [employer]. (2013-08-23 13:43:33)
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
## 2014-12-23 ENCOUNTER — Other Ambulatory Visit: Payer: Self-pay

## 2014-12-23 MED ORDER — CLONAZEPAM 0.5 MG PO TABS: 0.5000 mg | ORAL_TABLET | Freq: Every day | ORAL | Status: DC

## 2014-12-23 NOTE — Telephone Encounter (Signed)
RX request received from [facility]- sig is incorrect, request is for Clonazepam 0.5 mg, one tablet by mouth every 12 hours , sig should read Clonazepam 0.5 mg , take one tablet at bedtime.

## 2015-02-04 ENCOUNTER — Emergency Department (HOSPITAL_COMMUNITY)
Admission: EM | Admit: 2015-02-04 | Discharge: 2015-02-04 | Disposition: A | Discharge status: Home / Self Care | Payer: Self-pay | Attending: Emergency Medicine | Admitting: Emergency Medicine

## 2015-02-04 ENCOUNTER — Encounter (HOSPITAL_COMMUNITY): Payer: Self-pay | Admitting: Emergency Medicine

## 2015-02-04 ENCOUNTER — Other Ambulatory Visit: Payer: Self-pay

## 2015-02-04 ENCOUNTER — Emergency Department (HOSPITAL_COMMUNITY): Payer: Self-pay

## 2015-02-04 DIAGNOSIS — Z880 Allergy status to penicillin: Secondary | ICD-10-CM | POA: Insufficient documentation

## 2015-02-04 DIAGNOSIS — Z87828 Personal history of other (healed) physical injury and trauma: Secondary | ICD-10-CM | POA: Insufficient documentation

## 2015-02-04 DIAGNOSIS — Z79899 Other long term (current) drug therapy: Secondary | ICD-10-CM | POA: Insufficient documentation

## 2015-02-04 DIAGNOSIS — M5489 Other dorsalgia: Secondary | ICD-10-CM

## 2015-02-04 DIAGNOSIS — R0789 Other chest pain: Secondary | ICD-10-CM | POA: Insufficient documentation

## 2015-02-04 DIAGNOSIS — R05 Cough: Secondary | ICD-10-CM | POA: Insufficient documentation

## 2015-02-04 DIAGNOSIS — Z8739 Personal history of other diseases of the musculoskeletal system and connective tissue: Secondary | ICD-10-CM | POA: Insufficient documentation

## 2015-02-04 DIAGNOSIS — G8929 Other chronic pain: Secondary | ICD-10-CM | POA: Insufficient documentation

## 2015-02-04 DIAGNOSIS — Z8781 Personal history of (healed) traumatic fracture: Secondary | ICD-10-CM | POA: Insufficient documentation

## 2015-02-04 HISTORY — DX: Other injury of unspecified body region, initial encounter: T14.8XXA

## 2015-02-04 LAB — CBC WITH DIFFERENTIAL/PLATELET
BASOS ABS: 0 10*3/uL (ref 0.0–0.1)
Basophils Relative: 0 % (ref 0–1)
EOS ABS: 0 10*3/uL (ref 0.0–0.7)
Eosinophils Relative: 1 % (ref 0–5)
HCT: 50.2 % (ref 39.0–52.0)
HEMOGLOBIN: 18 g/dL — AB (ref 13.0–17.0)
Lymphocytes Relative: 31 % (ref 12–46)
Lymphs Abs: 2.6 10*3/uL (ref 0.7–4.0)
MCH: 31.8 pg (ref 26.0–34.0)
MCHC: 35.9 g/dL (ref 30.0–36.0)
MCV: 88.7 fL (ref 78.0–100.0)
MONO ABS: 0.8 10*3/uL (ref 0.1–1.0)
Monocytes Relative: 10 % (ref 3–12)
NEUTROS ABS: 5 10*3/uL (ref 1.7–7.7)
NEUTROS PCT: 58 % (ref 43–77)
PLATELETS: 263 10*3/uL (ref 150–400)
RBC: 5.66 MIL/uL (ref 4.22–5.81)
RDW: 12.9 % (ref 11.5–15.5)
WBC: 8.5 10*3/uL (ref 4.0–10.5)

## 2015-02-04 LAB — BASIC METABOLIC PANEL
ANION GAP: 14 (ref 5–15)
BUN: 12 mg/dL (ref 6–23)
CALCIUM: 9.9 mg/dL (ref 8.4–10.5)
CO2: 23 mmol/L (ref 19–32)
Chloride: 103 mmol/L (ref 96–112)
Creatinine, Ser: 1.3 mg/dL (ref 0.50–1.35)
GFR calc Af Amer: 75 mL/min — ABNORMAL LOW (ref >90)
GFR calc non Af Amer: 65 mL/min — ABNORMAL LOW (ref >90)
Glucose, Bld: 113 mg/dL — ABNORMAL HIGH (ref 70–99)
Potassium: 3.5 mmol/L (ref 3.5–5.1)
Sodium: 140 mmol/L (ref 135–145)

## 2015-02-04 LAB — I-STAT TROPONIN, ED: Troponin i, poc: 0 ng/mL (ref 0.00–0.08)

## 2015-02-04 MED ORDER — MORPHINE SULFATE 4 MG/ML IJ SOLN
4.0000 mg | Freq: Once | INTRAMUSCULAR | Status: AC
  Administered 2015-02-04: 4 mg via INTRAVENOUS
  Filled 2015-02-04: qty 1

## 2015-02-04 MED ORDER — SODIUM CHLORIDE 0.9 % IV BOLUS (SEPSIS)
1000.0000 mL | Freq: Once | INTRAVENOUS | Status: AC
  Administered 2015-02-04: 1000 mL via INTRAVENOUS

## 2015-02-04 MED ORDER — HYDROMORPHONE HCL 1 MG/ML IJ SOLN
1.0000 mg | Freq: Once | INTRAMUSCULAR | Status: AC
  Administered 2015-02-04: 1 mg via INTRAVENOUS
  Filled 2015-02-04: qty 1

## 2015-02-04 MED ORDER — OXYCODONE-ACETAMINOPHEN 5-325 MG PO TABS: 1.0000 | ORAL_TABLET | Freq: Four times a day (QID) | ORAL | Status: DC | PRN

## 2015-02-04 MED ORDER — DIAZEPAM 5 MG/ML IJ SOLN
5.0000 mg | Freq: Once | INTRAMUSCULAR | Status: AC
  Administered 2015-02-04: 5 mg via INTRAVENOUS
  Filled 2015-02-04: qty 2

## 2015-02-04 MED ORDER — DIAZEPAM 5 MG PO TABS: 5.0000 mg | ORAL_TABLET | Freq: Four times a day (QID) | ORAL | Status: DC | PRN
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
## 2015-02-04 NOTE — Discharge Instructions (Signed)
Take motrin for pain.   Take percocet for severe pain. Take valium for muscle spasms. Do NOT drive with it.   Follow up with your doctor.   Return to ER if you have worse chest pain, shortness of breath.

## 2015-02-04 NOTE — ED Notes (Signed)
MD at bedside. 

## 2015-02-20 ENCOUNTER — Other Ambulatory Visit: Payer: Self-pay | Admitting: Physician Assistant

## 2015-02-21 ENCOUNTER — Emergency Department (HOSPITAL_COMMUNITY): Payer: BLUE CROSS/BLUE SHIELD

## 2015-02-21 ENCOUNTER — Encounter (HOSPITAL_COMMUNITY): Payer: Self-pay | Admitting: Emergency Medicine

## 2015-02-21 DIAGNOSIS — Z8781 Personal history of (healed) traumatic fracture: Secondary | ICD-10-CM | POA: Insufficient documentation

## 2015-02-21 DIAGNOSIS — Z79899 Other long term (current) drug therapy: Secondary | ICD-10-CM | POA: Insufficient documentation

## 2015-02-21 DIAGNOSIS — Z880 Allergy status to penicillin: Secondary | ICD-10-CM | POA: Insufficient documentation

## 2015-02-21 DIAGNOSIS — G8929 Other chronic pain: Secondary | ICD-10-CM | POA: Insufficient documentation

## 2015-02-21 DIAGNOSIS — Z87828 Personal history of other (healed) physical injury and trauma: Secondary | ICD-10-CM | POA: Insufficient documentation

## 2015-02-21 LAB — CBC WITH DIFFERENTIAL/PLATELET
BASOS PCT: 0 % (ref 0–1)
Basophils Absolute: 0 10*3/uL (ref 0.0–0.1)
Eosinophils Absolute: 0.1 10*3/uL (ref 0.0–0.7)
Eosinophils Relative: 1 % (ref 0–5)
HCT: 48 % (ref 39.0–52.0)
Hemoglobin: 16.9 g/dL (ref 13.0–17.0)
Lymphocytes Relative: 39 % (ref 12–46)
Lymphs Abs: 3.3 10*3/uL (ref 0.7–4.0)
MCH: 31.7 pg (ref 26.0–34.0)
MCHC: 35.2 g/dL (ref 30.0–36.0)
MCV: 90.1 fL (ref 78.0–100.0)
Monocytes Absolute: 0.6 10*3/uL (ref 0.1–1.0)
Monocytes Relative: 7 % (ref 3–12)
NEUTROS ABS: 4.5 10*3/uL (ref 1.7–7.7)
NEUTROS PCT: 53 % (ref 43–77)
Platelets: 250 10*3/uL (ref 150–400)
RBC: 5.33 MIL/uL (ref 4.22–5.81)
RDW: 13.2 % (ref 11.5–15.5)
WBC: 8.4 10*3/uL (ref 4.0–10.5)

## 2015-02-21 LAB — BASIC METABOLIC PANEL
Anion gap: 9 (ref 5–15)
BUN: 8 mg/dL (ref 6–23)
CHLORIDE: 106 mmol/L (ref 96–112)
CO2: 24 mmol/L (ref 19–32)
CREATININE: 1.11 mg/dL (ref 0.50–1.35)
Calcium: 9.2 mg/dL (ref 8.4–10.5)
GFR calc non Af Amer: 79 mL/min — ABNORMAL LOW (ref >90)
Glucose, Bld: 120 mg/dL — ABNORMAL HIGH (ref 70–99)
POTASSIUM: 3.3 mmol/L — AB (ref 3.5–5.1)
Sodium: 139 mmol/L (ref 135–145)

## 2015-02-21 LAB — I-STAT TROPONIN, ED: Troponin i, poc: 0.01 ng/mL (ref 0.00–0.08)
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
## 2015-02-22 ENCOUNTER — Encounter (HOSPITAL_COMMUNITY): Payer: Self-pay | Admitting: Emergency Medicine

## 2015-02-22 ENCOUNTER — Emergency Department (HOSPITAL_COMMUNITY)
Admission: EM | Admit: 2015-02-22 | Discharge: 2015-02-22 | Disposition: A | Discharge status: Home / Self Care | Payer: BLUE CROSS/BLUE SHIELD | Attending: Emergency Medicine | Admitting: Emergency Medicine

## 2015-02-22 DIAGNOSIS — Z87828 Personal history of other (healed) physical injury and trauma: Secondary | ICD-10-CM | POA: Insufficient documentation

## 2015-02-22 DIAGNOSIS — Z880 Allergy status to penicillin: Secondary | ICD-10-CM | POA: Insufficient documentation

## 2015-02-22 DIAGNOSIS — G8929 Other chronic pain: Secondary | ICD-10-CM | POA: Insufficient documentation

## 2015-02-22 DIAGNOSIS — Z8739 Personal history of other diseases of the musculoskeletal system and connective tissue: Secondary | ICD-10-CM | POA: Insufficient documentation

## 2015-02-22 DIAGNOSIS — Z8781 Personal history of (healed) traumatic fracture: Secondary | ICD-10-CM | POA: Insufficient documentation

## 2015-02-22 DIAGNOSIS — R0789 Other chest pain: Secondary | ICD-10-CM | POA: Insufficient documentation

## 2015-02-22 LAB — BASIC METABOLIC PANEL
Anion gap: 7 (ref 5–15)
BUN: 10 mg/dL (ref 6–23)
CO2: 29 mmol/L (ref 19–32)
Calcium: 9.3 mg/dL (ref 8.4–10.5)
Chloride: 103 mmol/L (ref 96–112)
Creatinine, Ser: 1.13 mg/dL (ref 0.50–1.35)
GFR calc non Af Amer: 77 mL/min — ABNORMAL LOW (ref >90)
GFR, EST AFRICAN AMERICAN: 89 mL/min — AB (ref >90)
Glucose, Bld: 136 mg/dL — ABNORMAL HIGH (ref 70–99)
Potassium: 3.3 mmol/L — ABNORMAL LOW (ref 3.5–5.1)
Sodium: 139 mmol/L (ref 135–145)

## 2015-02-22 LAB — I-STAT TROPONIN, ED: TROPONIN I, POC: 0 ng/mL (ref 0.00–0.08)

## 2015-02-22 LAB — CBC
HCT: 48 % (ref 39.0–52.0)
Hemoglobin: 16.2 g/dL (ref 13.0–17.0)
MCH: 30.6 pg (ref 26.0–34.0)
MCHC: 33.8 g/dL (ref 30.0–36.0)
MCV: 90.7 fL (ref 78.0–100.0)
Platelets: 246 10*3/uL (ref 150–400)
RBC: 5.29 MIL/uL (ref 4.22–5.81)
RDW: 13.1 % (ref 11.5–15.5)
WBC: 8.5 10*3/uL (ref 4.0–10.5)

## 2015-02-22 MED ORDER — METHOCARBAMOL 500 MG PO TABS
1000.0000 mg | ORAL_TABLET | Freq: Three times a day (TID) | ORAL | Status: DC
Start: 2015-02-22 — End: 2015-02-22

## 2015-02-22 MED ORDER — IBUPROFEN 800 MG PO TABS
800.0000 mg | ORAL_TABLET | Freq: Once | ORAL | Status: AC
  Administered 2015-02-22: 800 mg via ORAL
  Filled 2015-02-22: qty 1

## 2015-02-22 MED ORDER — METHOCARBAMOL 500 MG PO TABS: 1000.0000 mg | ORAL_TABLET | Freq: Three times a day (TID) | ORAL | Status: DC

## 2015-02-22 MED ORDER — KETOROLAC TROMETHAMINE 60 MG/2ML IM SOLN
60.0000 mg | Freq: Once | INTRAMUSCULAR | Status: AC
  Administered 2015-02-22: 60 mg via INTRAMUSCULAR
  Filled 2015-02-22: qty 2
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
## 2015-02-22 NOTE — Discharge Instructions (Signed)
Chronic Pain Aaron [name], you were seen today for your chronic chest pain. Your x-ray does not show any abnormalities. Continue to take Motrin as needed for your pain and follow-up with her primary care physician or chronic pain clinic for continued treatment. If symptoms worsen come back to emergency department immediately. Thank you. Chronic pain can be defined as pain that is off and on and lasts for 3-6 months or longer. Many things cause chronic pain, which can make it difficult to make a diagnosis. There are many treatment options available for chronic pain. However, finding a treatment that works well for you may require trying various approaches until the right one is found. Many people benefit from a combination of two or more types of treatment to control their pain. SYMPTOMS  Chronic pain can occur anywhere in the body and can range from mild to very severe. Some types of chronic pain include:  Headache.  Low back pain.  Cancer pain.  Arthritis pain.  Neurogenic pain. This is pain resulting from damage to nerves. People with chronic pain may also have other symptoms such as:  Depression.  Anger.  Insomnia.  Anxiety. DIAGNOSIS  Your health care provider will help diagnose your condition over time. In many cases, the initial focus will be on excluding possible conditions that could be causing the pain. Depending on your symptoms, your health care provider may order tests to diagnose your condition. Some of these tests may include:   Blood tests.   CT scan.   MRI.   X-rays.   Ultrasounds.   Nerve conduction studies.  You may need to see a specialist.  TREATMENT  Finding treatment that works well may take time. You may be referred to a pain specialist. He or she may prescribe medicine or therapies, such as:   Mindful meditation or yoga.  Shots (injections) of numbing or pain-relieving medicines into the spine or area of pain.  Local electrical  stimulation.  Acupuncture.   Massage therapy.   Aroma, color, light, or sound therapy.   Biofeedback.   Working with a physical therapist to keep from getting stiff.   Regular, gentle exercise.   Cognitive or behavioral therapy.   Group support.  Sometimes, surgery may be recommended.  HOME CARE INSTRUCTIONS   Take all medicines as directed by your health care provider.   Lessen stress in your life by relaxing and doing things such as listening to calming music.   Exercise or be active as directed by your health care provider.   Eat a healthy diet and include things such as vegetables, fruits, fish, and lean meats in your diet.   Keep all follow-up appointments with your health care provider.   Attend a support group with others suffering from chronic pain. SEEK MEDICAL CARE IF:   Your pain gets worse.   You develop a new pain that was not there before.   You cannot tolerate medicines given to you by your health care provider.   You have new symptoms since your last visit with your health care provider.  SEEK IMMEDIATE MEDICAL CARE IF:   You feel weak.   You have decreased sensation or numbness.   You lose control of bowel or bladder function.   Your pain suddenly gets much worse.   You develop shaking.  You develop chills.  You develop confusion.  You develop chest pain.  You develop shortness of breath.  MAKE SURE YOU:  Understand these instructions.  Will watch your  condition.  Will get help right away if you are not doing well or get worse. Document Released: 09/04/2002 Document Revised: 08/15/2013 Document Reviewed: 06/08/2013 [facility] Patient Information 2015 [place], [place]. This information is not intended to replace advice given to you by your health care provider. Make sure you discuss any questions you have with your health care provider.
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
## 2015-02-22 NOTE — Discharge Instructions (Signed)
1. Medications: robaxin, usual home medications 2. Treatment: rest, drink plenty of fluids, ibuprofen as needed for pain control 3. Follow Up: Please followup with your primary doctor in 2 days for discussion of your diagnoses and further evaluation after today's visit; if you do not have a primary care doctor use the resource guide provided to find one; Please return to the ER for shortness breath, chest pain that radiates to the jaw or arm or is associated with diaphoresis or syncope.   Chest Wall Pain Chest wall pain is pain in or around the bones and muscles of your chest. It may take up to 6 weeks to get better. It may take longer if you must stay physically active in your work and activities.  CAUSES  Chest wall pain may happen on its own. However, it may be caused by:  A viral illness like the flu.  Injury.  Coughing.  Exercise.  Arthritis.  Fibromyalgia.  Shingles. HOME CARE INSTRUCTIONS   Avoid overtiring physical activity. Try not to strain or perform activities that cause pain. This includes any activities using your chest or your abdominal and side muscles, especially if heavy weights are used.  Put ice on the sore area.  Put ice in a plastic bag.  Place a towel between your skin and the bag.  Leave the ice on for 15-20 minutes per hour while awake for the first 2 days.  Only take over-the-counter or prescription medicines for pain, discomfort, or fever as directed by your caregiver. SEEK IMMEDIATE MEDICAL CARE IF:   Your pain increases, or you are very uncomfortable.  You have a fever.  Your chest pain becomes worse.  You have new, unexplained symptoms.  You have nausea or vomiting.  You feel sweaty or lightheaded.  You have a cough with phlegm (sputum), or you cough up blood. MAKE SURE YOU:   Understand these instructions.  Will watch your condition.  Will get help right away if you are not doing well or get worse. Document Released: 12/13/2005  Document Revised: 03/06/2012 Document Reviewed: 08/09/2011 [facility] Patient Information 2015 [place], [place]. This information is not intended to replace advice given to you by your health care provider. Make sure you discuss any questions you have with your health care provider.
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
## 2015-03-21 ENCOUNTER — Other Ambulatory Visit: Payer: Self-pay | Admitting: Physician Assistant
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
## 2016-01-05 ENCOUNTER — Encounter (HOSPITAL_COMMUNITY): Payer: Self-pay | Admitting: Emergency Medicine

## 2016-01-05 ENCOUNTER — Emergency Department (HOSPITAL_COMMUNITY): Payer: BLUE CROSS/BLUE SHIELD

## 2016-01-05 ENCOUNTER — Emergency Department (HOSPITAL_COMMUNITY)
Admission: EM | Admit: 2016-01-05 | Discharge: 2016-01-05 | Disposition: A | Discharge status: Home / Self Care | Payer: BLUE CROSS/BLUE SHIELD | Attending: Emergency Medicine | Admitting: Emergency Medicine

## 2016-01-05 DIAGNOSIS — Y9389 Activity, other specified: Secondary | ICD-10-CM | POA: Insufficient documentation

## 2016-01-05 DIAGNOSIS — W540XXA Bitten by dog, initial encounter: Secondary | ICD-10-CM | POA: Insufficient documentation

## 2016-01-05 DIAGNOSIS — Y998 Other external cause status: Secondary | ICD-10-CM | POA: Insufficient documentation

## 2016-01-05 DIAGNOSIS — S61412A Laceration without foreign body of left hand, initial encounter: Secondary | ICD-10-CM | POA: Insufficient documentation

## 2016-01-05 DIAGNOSIS — Z880 Allergy status to penicillin: Secondary | ICD-10-CM | POA: Insufficient documentation

## 2016-01-05 DIAGNOSIS — Z87828 Personal history of other (healed) physical injury and trauma: Secondary | ICD-10-CM | POA: Insufficient documentation

## 2016-01-05 DIAGNOSIS — G8929 Other chronic pain: Secondary | ICD-10-CM | POA: Insufficient documentation

## 2016-01-05 DIAGNOSIS — Y9289 Other specified places as the place of occurrence of the external cause: Secondary | ICD-10-CM | POA: Insufficient documentation

## 2016-01-05 DIAGNOSIS — Z79899 Other long term (current) drug therapy: Secondary | ICD-10-CM | POA: Insufficient documentation

## 2016-01-05 DIAGNOSIS — Z8781 Personal history of (healed) traumatic fracture: Secondary | ICD-10-CM | POA: Insufficient documentation

## 2016-01-05 MED ORDER — OXYCODONE-ACETAMINOPHEN 5-325 MG PO TABS: 1.0000 | ORAL_TABLET | Freq: Four times a day (QID) | ORAL | Status: DC | PRN

## 2016-01-05 MED ORDER — DOXYCYCLINE HYCLATE 100 MG PO TABS: 100.0000 mg | ORAL_TABLET | Freq: Two times a day (BID) | ORAL | Status: DC

## 2016-01-05 MED ORDER — OXYCODONE-ACETAMINOPHEN 5-325 MG PO TABS
1.0000 | ORAL_TABLET | Freq: Once | ORAL | Status: AC
  Administered 2016-01-05: 1 via ORAL
  Filled 2016-01-05: qty 1

## 2016-01-05 MED ORDER — CLINDAMYCIN HCL 300 MG PO CAPS: 300.0000 mg | ORAL_CAPSULE | Freq: Three times a day (TID) | ORAL | Status: DC

## 2016-01-05 NOTE — ED Notes (Signed)
Pt reports being bit by dog approx 30 min ago, through and through bite noted to pt left hand. Pulses present. Swelling noted to hand as well. Dog was up to date on shots according to pets owner.

## 2016-01-05 NOTE — Discharge Instructions (Signed)
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
## 2016-02-06 ENCOUNTER — Encounter (HOSPITAL_COMMUNITY): Payer: Self-pay | Admitting: Emergency Medicine

## 2016-02-06 ENCOUNTER — Emergency Department (HOSPITAL_COMMUNITY): Payer: Self-pay

## 2016-02-06 ENCOUNTER — Emergency Department (HOSPITAL_COMMUNITY)
Admission: EM | Admit: 2016-02-06 | Discharge: 2016-02-06 | Disposition: A | Discharge status: Home / Self Care | Payer: Self-pay | Attending: Emergency Medicine | Admitting: Emergency Medicine

## 2016-02-06 DIAGNOSIS — G8929 Other chronic pain: Secondary | ICD-10-CM | POA: Insufficient documentation

## 2016-02-06 DIAGNOSIS — Z8781 Personal history of (healed) traumatic fracture: Secondary | ICD-10-CM | POA: Insufficient documentation

## 2016-02-06 DIAGNOSIS — M7542 Impingement syndrome of left shoulder: Secondary | ICD-10-CM | POA: Insufficient documentation

## 2016-02-06 DIAGNOSIS — M25512 Pain in left shoulder: Secondary | ICD-10-CM

## 2016-02-06 DIAGNOSIS — Z87828 Personal history of other (healed) physical injury and trauma: Secondary | ICD-10-CM | POA: Insufficient documentation

## 2016-02-06 DIAGNOSIS — R229 Localized swelling, mass and lump, unspecified: Secondary | ICD-10-CM | POA: Insufficient documentation

## 2016-02-06 DIAGNOSIS — Z79899 Other long term (current) drug therapy: Secondary | ICD-10-CM | POA: Insufficient documentation

## 2016-02-06 DIAGNOSIS — Z880 Allergy status to penicillin: Secondary | ICD-10-CM | POA: Insufficient documentation

## 2016-02-06 MED ORDER — OXYCODONE-ACETAMINOPHEN 5-325 MG PO TABS: 1.0000 | ORAL_TABLET | Freq: Four times a day (QID) | ORAL | Status: DC | PRN

## 2016-02-06 NOTE — Discharge Instructions (Signed)
You have been diagnosed with shoulder pain, impingement syndrome, and skin nodule.  Please follow-up with the orthopedic doctor.  2 groups have been listed.  See whoever can see you soonest.  You also need to have the nodule in your skin examined by a primary care doctor or dermatologist in the next 2-3 weeks.  Impingement Syndrome, Rotator Cuff, Bursitis With Rehab Impingement syndrome is a condition that involves inflammation of the tendons of the rotator cuff and the subacromial bursa, that causes pain in the shoulder. The rotator cuff consists of four tendons and muscles that control much of the shoulder and upper arm function. The subacromial bursa is a fluid filled sac that helps reduce friction between the rotator cuff and one of the bones of the shoulder (acromion). Impingement syndrome is usually an overuse injury that causes swelling of the bursa (bursitis), swelling of the tendon (tendonitis), and/or a tear of the tendon (strain). Strains are classified into three categories. Grade 1 strains cause pain, but the tendon is not lengthened. Grade 2 strains include a lengthened ligament, due to the ligament being stretched or partially ruptured. With grade 2 strains there is still function, although the function may be decreased. Grade 3 strains include a complete tear of the tendon or muscle, and function is usually impaired. SYMPTOMS   Pain around the shoulder, often at the outer portion of the upper arm.  Pain that gets worse with shoulder function, especially when reaching overhead or lifting.  Sometimes, aching when not using the arm.  Pain that wakes you up at night.  Sometimes, tenderness, swelling, warmth, or redness over the affected area.  Loss of strength.  Limited motion of the shoulder, especially reaching behind the back (to the back pocket or to unhook bra) or across your body.  Crackling sound (crepitation) when moving the arm.  Biceps tendon pain and inflammation (in the  front of the shoulder). Worse when bending the elbow or lifting. CAUSES  Impingement syndrome is often an overuse injury, in which chronic (repetitive) motions cause the tendons or bursa to become inflamed. A strain occurs when a force is paced on the tendon or muscle that is greater than it can withstand. Common mechanisms of injury include: Stress from sudden increase in duration, frequency, or intensity of training.  Direct hit (trauma) to the shoulder.  Aging, erosion of the tendon with normal use.  Bony bump on shoulder (acromial spur). RISK INCREASES WITH:  Contact sports (football, wrestling, boxing).  Throwing sports (baseball, tennis, volleyball).  Weightlifting and bodybuilding.  Heavy labor.  Previous injury to the rotator cuff, including impingement.  Poor shoulder strength and flexibility.  Failure to warm up properly before activity.  Inadequate protective equipment.  Old age.  Bony bump on shoulder (acromial spur). PREVENTION   Warm up and stretch properly before activity.  Allow for adequate recovery between workouts.  Maintain physical fitness:  Strength, flexibility, and endurance.  Cardiovascular fitness.  Learn and use proper exercise technique. PROGNOSIS  If treated properly, impingement syndrome usually goes away within 6 weeks. Sometimes surgery is required.  RELATED COMPLICATIONS   Longer healing time if not properly treated, or if not given enough time to heal.  Recurring symptoms, that result in a chronic condition.  Shoulder stiffness, frozen shoulder, or loss of motion.  Rotator cuff tendon tear.  Recurring symptoms, especially if activity is resumed too soon, with overuse, with a direct blow, or when using poor technique. TREATMENT  Treatment first involves the use of  ice and medicine, to reduce pain and inflammation. The use of strengthening and stretching exercises may help reduce pain with activity. These exercises may be  performed at home or with a therapist. If non-surgical treatment is unsuccessful after more than 6 months, surgery may be advised. After surgery and rehabilitation, activity is usually possible in 3 months.  MEDICATION  If pain medicine is needed, nonsteroidal anti-inflammatory medicines (aspirin and ibuprofen), or other minor pain relievers (acetaminophen), are often advised.  Do not take pain medicine for 7 days before surgery.  Prescription pain relievers may be given, if your caregiver thinks they are needed. Use only as directed and only as much as you need.  Corticosteroid injections may be given by your caregiver. These injections should be reserved for the most serious cases, because they may only be given a certain number of times. HEAT AND COLD  Cold treatment (icing) should be applied for 10 to 15 minutes every 2 to 3 hours for inflammation and pain, and immediately after activity that aggravates your symptoms. Use ice packs or an ice massage.  Heat treatment may be used before performing stretching and strengthening activities prescribed by your caregiver, physical therapist, or athletic trainer. Use a heat pack or a warm water soak. SEEK MEDICAL CARE IF:   Symptoms get worse or do not improve in 4 to 6 weeks, despite treatment.  New, unexplained symptoms develop. (Drugs used in treatment may produce side effects.) EXERCISES  RANGE OF MOTION (ROM) AND STRETCHING EXERCISES - Impingement Syndrome (Rotator Cuff  Tendinitis, Bursitis) These exercises may help you when beginning to rehabilitate your injury. Your symptoms may go away with or without further involvement from your physician, physical therapist or athletic trainer. While completing these exercises, remember:   Restoring tissue flexibility helps normal motion to return to the joints. This allows healthier, less painful movement and activity.  An effective stretch should be held for at least 30 seconds.  A stretch  should never be painful. You should only feel a gentle lengthening or release in the stretched tissue. STRETCH - Flexion, Standing  Stand with good posture. With an underhand grip on your right / left hand, and an overhand grip on the opposite hand, grasp a broomstick or cane so that your hands are a little more than shoulder width apart.  Keeping your right / left elbow straight and shoulder muscles relaxed, push the stick with your opposite hand, to raise your right / left arm in front of your body and then overhead. Raise your arm until you feel a stretch in your right / left shoulder, but before you have increased shoulder pain.  Try to avoid shrugging your right / left shoulder as your arm rises, by keeping your shoulder blade tucked down and toward your mid-back spine. Hold for __________ seconds.  Slowly return to the starting position. Repeat __________ times. Complete this exercise __________ times per day. STRETCH - Abduction, Supine  Lie on your back. With an underhand grip on your right / left hand and an overhand grip on the opposite hand, grasp a broomstick or cane so that your hands are a little more than shoulder width apart.  Keeping your right / left elbow straight and your shoulder muscles relaxed, push the stick with your opposite hand, to raise your right / left arm out to the side of your body and then overhead. Raise your arm until you feel a stretch in your right / left shoulder, but before you have increased  shoulder pain.  Try to avoid shrugging your right / left shoulder as your arm rises, by keeping your shoulder blade tucked down and toward your mid-back spine. Hold for __________ seconds.  Slowly return to the starting position. Repeat __________ times. Complete this exercise __________ times per day. ROM - Flexion, Active-Assisted  Lie on your back. You may bend your knees for comfort.  Grasp a broomstick or cane so your hands are about shoulder width apart.  Your right / left hand should grip the end of the stick, so that your hand is positioned "thumbs-up," as if you were about to shake hands.  Using your healthy arm to lead, raise your right / left arm overhead, until you feel a gentle stretch in your shoulder. Hold for __________ seconds.  Use the stick to assist in returning your right / left arm to its starting position. Repeat __________ times. Complete this exercise __________ times per day.  ROM - Internal Rotation, Supine   Lie on your back on a firm surface. Place your right / left elbow about 60 degrees away from your side. Elevate your elbow with a folded towel, so that the elbow and shoulder are the same height.  Using a broomstick or cane and your strong arm, pull your right / left hand toward your body until you feel a gentle stretch, but no increase in your shoulder pain. Keep your shoulder and elbow in place throughout the exercise.  Hold for __________ seconds. Slowly return to the starting position. Repeat __________ times. Complete this exercise __________ times per day. STRETCH - Internal Rotation  Place your right / left hand behind your back, palm up.  Throw a towel or belt over your opposite shoulder. Grasp the towel with your right / left hand.  While keeping an upright posture, gently pull up on the towel, until you feel a stretch in the front of your right / left shoulder.  Avoid shrugging your right / left shoulder as your arm rises, by keeping your shoulder blade tucked down and toward your mid-back spine.  Hold for __________ seconds. Release the stretch, by lowering your healthy hand. Repeat __________ times. Complete this exercise __________ times per day. ROM - Internal Rotation   Using an underhand grip, grasp a stick behind your back with both hands.  While standing upright with good posture, slide the stick up your back until you feel a mild stretch in the front of your shoulder.  Hold for __________  seconds. Slowly return to your starting position. Repeat __________ times. Complete this exercise __________ times per day.  STRETCH - Posterior Shoulder Capsule   Stand or sit with good posture. Grasp your right / left elbow and draw it across your chest, keeping it at the same height as your shoulder.  Pull your elbow, so your upper arm comes in closer to your chest. Pull until you feel a gentle stretch in the back of your shoulder.  Hold for __________ seconds. Repeat __________ times. Complete this exercise __________ times per day. STRENGTHENING EXERCISES - Impingement Syndrome (Rotator Cuff Tendinitis, Bursitis) These exercises may help you when beginning to rehabilitate your injury. They may resolve your symptoms with or without further involvement from your physician, physical therapist or athletic trainer. While completing these exercises, remember:  Muscles can gain both the endurance and the strength needed for everyday activities through controlled exercises.  Complete these exercises as instructed by your physician, physical therapist or athletic trainer. Increase the resistance and repetitions  only as guided.  You may experience muscle soreness or fatigue, but the pain or discomfort you are trying to eliminate should never worsen during these exercises. If this pain does get worse, stop and make sure you are following the directions exactly. If the pain is still present after adjustments, discontinue the exercise until you can discuss the trouble with your clinician.  During your recovery, avoid activity or exercises which involve actions that place your injured hand or elbow above your head or behind your back or head. These positions stress the tissues which you are trying to heal. STRENGTH - Scapular Depression and Adduction   With good posture, sit on a firm chair. Support your arms in front of you, with pillows, arm rests, or on a table top. Have your elbows in line with the  sides of your body.  Gently draw your shoulder blades down and toward your mid-back spine. Gradually increase the tension, without tensing the muscles along the top of your shoulders and the back of your neck.  Hold for __________ seconds. Slowly release the tension and relax your muscles completely before starting the next repetition.  After you have practiced this exercise, remove the arm support and complete the exercise in standing as well as sitting position. Repeat __________ times. Complete this exercise __________ times per day.  STRENGTH - Shoulder Abductors, Isometric  With good posture, stand or sit about 4-6 inches from a wall, with your right / left side facing the wall.  Bend your right / left elbow. Gently press your right / left elbow into the wall. Increase the pressure gradually, until you are pressing as hard as you can, without shrugging your shoulder or increasing any shoulder discomfort.  Hold for __________ seconds.  Release the tension slowly. Relax your shoulder muscles completely before you begin the next repetition. Repeat __________ times. Complete this exercise __________ times per day.  STRENGTH - External Rotators, Isometric  Keep your right / left elbow at your side and bend it 90 degrees.  Step into a door frame so that the outside of your right / left wrist can press against the door frame without your upper arm leaving your side.  Gently press your right / left wrist into the door frame, as if you were trying to swing the back of your hand away from your stomach. Gradually increase the tension, until you are pressing as hard as you can, without shrugging your shoulder or increasing any shoulder discomfort.  Hold for __________ seconds.  Release the tension slowly. Relax your shoulder muscles completely before you begin the next repetition. Repeat __________ times. Complete this exercise __________ times per day.  STRENGTH - Supraspinatus   Stand or  sit with good posture. Grasp a __________ weight, or an exercise band or tubing, so that your hand is "thumbs-up," like you are shaking hands.  Slowly lift your right / left arm in a "V" away from your thigh, diagonally into the space between your side and straight ahead. Lift your hand to shoulder height or as far as you can, without increasing any shoulder pain. At first, many people do not lift their hands above shoulder height.  Avoid shrugging your right / left shoulder as your arm rises, by keeping your shoulder blade tucked down and toward your mid-back spine.  Hold for __________ seconds. Control the descent of your hand, as you slowly return to your starting position. Repeat __________ times. Complete this exercise __________ times per day.  STRENGTH -  External Rotators  Secure a rubber exercise band or tubing to a fixed object (table, pole) so that it is at the same height as your right / left elbow when you are standing or sitting on a firm surface.  Stand or sit so that the secured exercise band is at your uninjured side.  Bend your right / left elbow 90 degrees. Place a folded towel or small pillow under your right / left arm, so that your elbow is a few inches away from your side.  Keeping the tension on the exercise band, pull it away from your body, as if pivoting on your elbow. Be sure to keep your body steady, so that the movement is coming only from your rotating shoulder.  Hold for __________ seconds. Release the tension in a controlled manner, as you return to the starting position. Repeat __________ times. Complete this exercise __________ times per day.  STRENGTH - Internal Rotators   Secure a rubber exercise band or tubing to a fixed object (table, pole) so that it is at the same height as your right / left elbow when you are standing or sitting on a firm surface.  Stand or sit so that the secured exercise band is at your right / left side.  Bend your elbow 90  degrees. Place a folded towel or small pillow under your right / left arm so that your elbow is a few inches away from your side.  Keeping the tension on the exercise band, pull it across your body, toward your stomach. Be sure to keep your body steady, so that the movement is coming only from your rotating shoulder.  Hold for __________ seconds. Release the tension in a controlled manner, as you return to the starting position. Repeat __________ times. Complete this exercise __________ times per day.  STRENGTH - Scapular Protractors, Standing   Stand arms length away from a wall. Place your hands on the wall, keeping your elbows straight.  Begin by dropping your shoulder blades down and toward your mid-back spine.  To strengthen your protractors, keep your shoulder blades down, but slide them forward on your rib cage. It will feel as if you are lifting the back of your rib cage away from the wall. This is a subtle motion and can be challenging to complete. Ask your caregiver for further instruction, if you are not sure you are doing the exercise correctly.  Hold for __________ seconds. Slowly return to the starting position, resting the muscles completely before starting the next repetition. Repeat __________ times. Complete this exercise __________ times per day. STRENGTH - Scapular Protractors, Supine  Lie on your back on a firm surface. Extend your right / left arm straight into the air while holding a __________ weight in your hand.  Keeping your head and back in place, lift your shoulder off the floor.  Hold for __________ seconds. Slowly return to the starting position, and allow your muscles to relax completely before starting the next repetition. Repeat __________ times. Complete this exercise __________ times per day. STRENGTH - Scapular Protractors, Quadruped  Get onto your hands and knees, with your shoulders directly over your hands (or as close as you can be,  comfortably).  Keeping your elbows locked, lift the back of your rib cage up into your shoulder blades, so your mid-back rounds out. Keep your neck muscles relaxed.  Hold this position for __________ seconds. Slowly return to the starting position and allow your muscles to relax completely before starting  the next repetition. Repeat __________ times. Complete this exercise __________ times per day.  STRENGTH - Scapular Retractors  Secure a rubber exercise band or tubing to a fixed object (table, pole), so that it is at the height of your shoulders when you are either standing, or sitting on a firm armless chair.  With a palm down grip, grasp an end of the band in each hand. Straighten your elbows and lift your hands straight in front of you, at shoulder height. Step back, away from the secured end of the band, until it becomes tense.  Squeezing your shoulder blades together, draw your elbows back toward your sides, as you bend them. Keep your upper arms lifted away from your body throughout the exercise.  Hold for __________ seconds. Slowly ease the tension on the band, as you reverse the directions and return to the starting position. Repeat __________ times. Complete this exercise __________ times per day. STRENGTH - Shoulder Extensors   Secure a rubber exercise band or tubing to a fixed object (table, pole) so that it is at the height of your shoulders when you are either standing, or sitting on a firm armless chair.  With a thumbs-up grip, grasp an end of the band in each hand. Straighten your elbows and lift your hands straight in front of you, at shoulder height. Step back, away from the secured end of the band, until it becomes tense.  Squeezing your shoulder blades together, pull your hands down to the sides of your thighs. Do not allow your hands to go behind you.  Hold for __________ seconds. Slowly ease the tension on the band, as you reverse the directions and return to the  starting position. Repeat __________ times. Complete this exercise __________ times per day.  STRENGTH - Scapular Retractors and External Rotators   Secure a rubber exercise band or tubing to a fixed object (table, pole) so that it is at the height as your shoulders, when you are either standing, or sitting on a firm armless chair.  With a palm down grip, grasp an end of the band in each hand. Bend your elbows 90 degrees and lift your elbows to shoulder height, at your sides. Step back, away from the secured end of the band, until it becomes tense.  Squeezing your shoulder blades together, rotate your shoulders so that your upper arms and elbows remain stationary, but your fists travel upward to head height.  Hold for __________ seconds. Slowly ease the tension on the band, as you reverse the directions and return to the starting position. Repeat __________ times. Complete this exercise __________ times per day.  STRENGTH - Scapular Retractors and External Rotators, Rowing   Secure a rubber exercise band or tubing to a fixed object (table, pole) so that it is at the height of your shoulders, when you are either standing, or sitting on a firm armless chair.  With a palm down grip, grasp an end of the band in each hand. Straighten your elbows and lift your hands straight in front of you, at shoulder height. Step back, away from the secured end of the band, until it becomes tense.  Step 1: Squeeze your shoulder blades together. Bending your elbows, draw your hands to your chest, as if you are rowing a boat. At the end of this motion, your hands and elbow should be at shoulder height and your elbows should be out to your sides.  Step 2: Rotate your shoulders, to raise your hands above  your head. Your forearms should be vertical and your upper arms should be horizontal.  Hold for __________ seconds. Slowly ease the tension on the band, as you reverse the directions and return to the starting  position. Repeat __________ times. Complete this exercise __________ times per day.  STRENGTH - Scapular Depressors  Find a sturdy chair without wheels, such as a dining room chair.  Keeping your feet on the floor, and your hands on the chair arms, lift your bottom up from the seat, and lock your elbows.  Keeping your elbows straight, allow gravity to pull your body weight down. Your shoulders will rise toward your ears.  Raise your body against gravity by drawing your shoulder blades down your back, shortening the distance between your shoulders and ears. Although your feet should always maintain contact with the floor, your feet should progressively support less body weight, as you get stronger.  Hold for __________ seconds. In a controlled and slow manner, lower your body weight to begin the next repetition. Repeat __________ times. Complete this exercise __________ times per day.    This information is not intended to replace advice given to you by your health care provider. Make sure you discuss any questions you have with your health care provider.   Document Released: 12/13/2005 Document Revised: 01/03/2015 Document Reviewed: 03/27/2009 [employer] Interactive Patient Education [employer]

## 2016-02-06 NOTE — ED Notes (Signed)
Patient states has had L shoulder pain x 1wk.   Patient denies injury.   Patient denies any symptoms.   Patient has been taking goodys at home for pain with no relief.  Patient states he has elevated, he has used heat and ice therapies with no relief.
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
## 2016-10-04 ENCOUNTER — Encounter (HOSPITAL_COMMUNITY): Payer: Self-pay

## 2016-10-04 ENCOUNTER — Emergency Department (HOSPITAL_COMMUNITY): Payer: Self-pay

## 2016-10-04 ENCOUNTER — Emergency Department (HOSPITAL_COMMUNITY)
Admission: EM | Admit: 2016-10-04 | Discharge: 2016-10-04 | Disposition: A | Discharge status: Home / Self Care | Payer: Self-pay | Attending: Emergency Medicine | Admitting: Emergency Medicine

## 2016-10-04 DIAGNOSIS — Y939 Activity, unspecified: Secondary | ICD-10-CM | POA: Insufficient documentation

## 2016-10-04 DIAGNOSIS — M79671 Pain in right foot: Secondary | ICD-10-CM

## 2016-10-04 DIAGNOSIS — I10 Essential (primary) hypertension: Secondary | ICD-10-CM | POA: Insufficient documentation

## 2016-10-04 DIAGNOSIS — Y999 Unspecified external cause status: Secondary | ICD-10-CM | POA: Insufficient documentation

## 2016-10-04 DIAGNOSIS — X509XXA Other and unspecified overexertion or strenuous movements or postures, initial encounter: Secondary | ICD-10-CM | POA: Insufficient documentation

## 2016-10-04 DIAGNOSIS — Y929 Unspecified place or not applicable: Secondary | ICD-10-CM | POA: Insufficient documentation

## 2016-10-04 MED ORDER — ONDANSETRON 4 MG PO TBDP
4.0000 mg | ORAL_TABLET | Freq: Once | ORAL | Status: AC
  Administered 2016-10-04: 4 mg via ORAL
  Filled 2016-10-04: qty 1

## 2016-10-04 MED ORDER — TRAMADOL HCL 50 MG PO TABS: 50.0000 mg | ORAL_TABLET | Freq: Four times a day (QID) | ORAL | 0 refills | Status: DC | PRN

## 2016-10-04 MED ORDER — TRAMADOL HCL 50 MG PO TABS
50.0000 mg | ORAL_TABLET | Freq: Once | ORAL | Status: AC
  Administered 2016-10-04: 50 mg via ORAL
  Filled 2016-10-04: qty 1

## 2016-10-04 NOTE — Discharge Instructions (Signed)
You likely have a ligament or tendon injury in your foot. Please follow up with orthopedics for re-evaluation. Elevate as much as possible. Take tramadol as needed for pain. Return to the Ed if you experience severe worsening of your symptoms, swelling, fevers or chills.

## 2016-10-04 NOTE — ED Notes (Signed)
Ortho tech paged for [employer]
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
## 2016-10-04 NOTE — ED Triage Notes (Signed)
Patient complains of right foot pain with ambulation after stepping off trailer yesterday and hearing pop to same. No obvious deformity

## 2016-10-04 NOTE — Progress Notes (Signed)
Orthopedic Tech Progress Note Patient Details:  Aaron [name] 1971-11-20 [id number]  Ortho Devices Type of Ortho Device: CAM walker Ortho Device/Splint Interventions: Application   Saul FordyceJennifer C Guage [place]erson 10/04/2016, 6:36 PM

## 2016-10-04 NOTE — ED Notes (Signed)
See PA note for secondary assessment.   

## 2017-02-02 ENCOUNTER — Emergency Department (HOSPITAL_COMMUNITY): Payer: BLUE CROSS/BLUE SHIELD

## 2017-02-02 ENCOUNTER — Emergency Department (HOSPITAL_COMMUNITY)
Admission: EM | Admit: 2017-02-02 | Discharge: 2017-02-02 | Disposition: A | Discharge status: Home / Self Care | Payer: BLUE CROSS/BLUE SHIELD | Attending: Emergency Medicine | Admitting: Emergency Medicine

## 2017-02-02 ENCOUNTER — Encounter (HOSPITAL_COMMUNITY): Payer: Self-pay | Admitting: Emergency Medicine

## 2017-02-02 DIAGNOSIS — I10 Essential (primary) hypertension: Secondary | ICD-10-CM | POA: Insufficient documentation

## 2017-02-02 DIAGNOSIS — J069 Acute upper respiratory infection, unspecified: Secondary | ICD-10-CM | POA: Insufficient documentation

## 2017-02-02 DIAGNOSIS — Z79899 Other long term (current) drug therapy: Secondary | ICD-10-CM | POA: Insufficient documentation

## 2017-02-02 DIAGNOSIS — R0789 Other chest pain: Secondary | ICD-10-CM | POA: Insufficient documentation

## 2017-02-02 DIAGNOSIS — J9801 Acute bronchospasm: Secondary | ICD-10-CM | POA: Insufficient documentation

## 2017-02-02 LAB — COMPREHENSIVE METABOLIC PANEL
ALBUMIN: 4 g/dL (ref 3.5–5.0)
ALT: 47 U/L (ref 17–63)
ANION GAP: 8 (ref 5–15)
AST: 34 U/L (ref 15–41)
Alkaline Phosphatase: 103 U/L (ref 38–126)
BUN: 8 mg/dL (ref 6–20)
CHLORIDE: 101 mmol/L (ref 101–111)
CO2: 27 mmol/L (ref 22–32)
Calcium: 9.4 mg/dL (ref 8.9–10.3)
Creatinine, Ser: 1.16 mg/dL (ref 0.61–1.24)
GFR calc Af Amer: >60 mL/min (ref >60)
GFR calc non Af Amer: >60 mL/min (ref >60)
GLUCOSE: 115 mg/dL — AB (ref 65–99)
POTASSIUM: 4.1 mmol/L (ref 3.5–5.1)
SODIUM: 136 mmol/L (ref 135–145)
Total Bilirubin: 0.7 mg/dL (ref 0.3–1.2)
Total Protein: 7.4 g/dL (ref 6.5–8.1)

## 2017-02-02 LAB — CBC WITH DIFFERENTIAL/PLATELET
BASOS ABS: 0 10*3/uL (ref 0.0–0.1)
BASOS PCT: 0 %
EOS ABS: 0 10*3/uL (ref 0.0–0.7)
Eosinophils Relative: 1 %
HCT: 46.6 % (ref 39.0–52.0)
Hemoglobin: 15.9 g/dL (ref 13.0–17.0)
Lymphocytes Relative: 34 %
Lymphs Abs: 1.9 10*3/uL (ref 0.7–4.0)
MCH: 30.5 pg (ref 26.0–34.0)
MCHC: 34.1 g/dL (ref 30.0–36.0)
MCV: 89.3 fL (ref 78.0–100.0)
MONO ABS: 0.6 10*3/uL (ref 0.1–1.0)
MONOS PCT: 12 %
NEUTROS ABS: 2.9 10*3/uL (ref 1.7–7.7)
NEUTROS PCT: 53 %
Platelets: 192 10*3/uL (ref 150–400)
RBC: 5.22 MIL/uL (ref 4.22–5.81)
RDW: 12.7 % (ref 11.5–15.5)
WBC: 5.4 10*3/uL (ref 4.0–10.5)

## 2017-02-02 LAB — LIPASE, BLOOD: Lipase: 28 U/L (ref 11–51)

## 2017-02-02 LAB — I-STAT TROPONIN, ED: Troponin i, poc: 0 ng/mL (ref 0.00–0.08)

## 2017-02-02 MED ORDER — ALBUTEROL SULFATE HFA 108 (90 BASE) MCG/ACT IN AERS: 1.0000 | INHALATION_SPRAY | RESPIRATORY_TRACT | Status: DC | PRN

## 2017-02-02 MED ORDER — BENZONATATE 100 MG PO CAPS: 100.0000 mg | ORAL_CAPSULE | Freq: Three times a day (TID) | ORAL | 0 refills | Status: DC

## 2017-02-02 MED ORDER — TRAMADOL HCL 50 MG PO TABS: 50.0000 mg | ORAL_TABLET | Freq: Four times a day (QID) | ORAL | 0 refills | Status: DC | PRN

## 2017-02-02 MED ORDER — PREDNISONE 20 MG PO TABS: ORAL_TABLET | ORAL | 0 refills | Status: DC

## 2017-02-02 MED ORDER — GI COCKTAIL ~~LOC~~
30.0000 mL | Freq: Once | ORAL | Status: AC
  Administered 2017-02-02: 30 mL via ORAL
  Filled 2017-02-02: qty 30

## 2017-02-02 MED ORDER — ALBUTEROL SULFATE (2.5 MG/3ML) 0.083% IN NEBU
5.0000 mg | INHALATION_SOLUTION | Freq: Once | RESPIRATORY_TRACT | Status: AC
  Administered 2017-02-02: 5 mg via RESPIRATORY_TRACT
  Filled 2017-02-02: qty 6
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
## 2017-02-02 NOTE — ED Triage Notes (Signed)
Pt here from home with c/o left sided chest pain from coughing , pt states that he feels the pain where he had a chest tube once before

## 2017-02-02 NOTE — ED Notes (Signed)
Patient transported to X-ray 

## 2017-03-05 ENCOUNTER — Emergency Department (HOSPITAL_COMMUNITY)
Admission: EM | Admit: 2017-03-05 | Discharge: 2017-03-06 | Discharge status: Against Medical Advice | Payer: BLUE CROSS/BLUE SHIELD | Attending: Emergency Medicine | Admitting: Emergency Medicine

## 2017-03-05 DIAGNOSIS — T40601A Poisoning by unspecified narcotics, accidental (unintentional), initial encounter: Secondary | ICD-10-CM

## 2017-03-05 DIAGNOSIS — T402X1A Poisoning by other opioids, accidental (unintentional), initial encounter: Secondary | ICD-10-CM | POA: Insufficient documentation

## 2017-03-05 DIAGNOSIS — I10 Essential (primary) hypertension: Secondary | ICD-10-CM | POA: Insufficient documentation
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
## 2017-03-05 NOTE — ED Notes (Signed)
Bed: [id number] Expected date:  Expected time:  Means of arrival:  Comments: EMS 46 yo male overdose percocet/narcan

## 2017-03-05 NOTE — ED Notes (Signed)
Pt still refusing care at this time.  Does not want labs drawn. Will inform Dr. Blinda LeatherwoodPollina.  Pt is deciding at this time if he is going to stay and be treated.

## 2017-03-05 NOTE — ED Notes (Signed)
Pt refused any care until his wife come,informed nurse

## 2018-06-28 ENCOUNTER — Emergency Department (HOSPITAL_COMMUNITY): Payer: Self-pay

## 2018-06-28 ENCOUNTER — Emergency Department (HOSPITAL_BASED_OUTPATIENT_CLINIC_OR_DEPARTMENT_OTHER)
Admit: 2018-06-28 | Discharge: 2018-06-28 | Disposition: A | Discharge status: Home / Self Care | Payer: Self-pay | Attending: Emergency Medicine | Admitting: Emergency Medicine

## 2018-06-28 ENCOUNTER — Other Ambulatory Visit: Payer: Self-pay

## 2018-06-28 ENCOUNTER — Encounter (HOSPITAL_COMMUNITY): Payer: Self-pay

## 2018-06-28 ENCOUNTER — Inpatient Hospital Stay (HOSPITAL_COMMUNITY)
Admission: EM | Admit: 2018-06-28 | Discharge: 2018-06-29 | DRG: 311 | Discharge status: Against Medical Advice | Payer: Self-pay | Attending: Family Medicine | Admitting: Family Medicine

## 2018-06-28 DIAGNOSIS — G8929 Other chronic pain: Secondary | ICD-10-CM | POA: Diagnosis present

## 2018-06-28 DIAGNOSIS — M7989 Other specified soft tissue disorders: Secondary | ICD-10-CM

## 2018-06-28 DIAGNOSIS — R072 Precordial pain: Secondary | ICD-10-CM

## 2018-06-28 DIAGNOSIS — I249 Acute ischemic heart disease, unspecified: Principal | ICD-10-CM | POA: Diagnosis present

## 2018-06-28 DIAGNOSIS — Z79891 Long term (current) use of opiate analgesic: Secondary | ICD-10-CM

## 2018-06-28 DIAGNOSIS — Z880 Allergy status to penicillin: Secondary | ICD-10-CM

## 2018-06-28 DIAGNOSIS — Z888 Allergy status to other drugs, medicaments and biological substances status: Secondary | ICD-10-CM

## 2018-06-28 DIAGNOSIS — Z8249 Family history of ischemic heart disease and other diseases of the circulatory system: Secondary | ICD-10-CM

## 2018-06-28 DIAGNOSIS — Z79899 Other long term (current) drug therapy: Secondary | ICD-10-CM

## 2018-06-28 DIAGNOSIS — R0789 Other chest pain: Secondary | ICD-10-CM

## 2018-06-28 LAB — CBC
HEMATOCRIT: 41.2 % (ref 39.0–52.0)
Hemoglobin: 13.4 g/dL (ref 13.0–17.0)
MCH: 28.4 pg (ref 26.0–34.0)
MCHC: 32.5 g/dL (ref 30.0–36.0)
MCV: 87.3 fL (ref 78.0–100.0)
PLATELETS: 251 10*3/uL (ref 150–400)
RBC: 4.72 MIL/uL (ref 4.22–5.81)
RDW: 12.7 % (ref 11.5–15.5)
WBC: 7.9 10*3/uL (ref 4.0–10.5)

## 2018-06-28 LAB — HEPATIC FUNCTION PANEL
ALBUMIN: 3.5 g/dL (ref 3.5–5.0)
ALK PHOS: 74 U/L (ref 38–126)
ALT: 22 U/L (ref 0–44)
AST: 18 U/L (ref 15–41)
BILIRUBIN TOTAL: 0.5 mg/dL (ref 0.3–1.2)
Bilirubin, Direct: <0.1 mg/dL (ref 0.0–0.2)
Total Protein: 6.5 g/dL (ref 6.5–8.1)

## 2018-06-28 LAB — BASIC METABOLIC PANEL
Anion gap: 10 (ref 5–15)
BUN: 9 mg/dL (ref 6–20)
CALCIUM: 8.9 mg/dL (ref 8.9–10.3)
CO2: 27 mmol/L (ref 22–32)
CREATININE: 1.06 mg/dL (ref 0.61–1.24)
Chloride: 103 mmol/L (ref 98–111)
GFR calc Af Amer: >60 mL/min (ref >60)
GLUCOSE: 153 mg/dL — AB (ref 70–99)
Potassium: 3.4 mmol/L — ABNORMAL LOW (ref 3.5–5.1)
Sodium: 140 mmol/L (ref 135–145)

## 2018-06-28 LAB — I-STAT TROPONIN, ED: TROPONIN I, POC: 0 ng/mL (ref 0.00–0.08)

## 2018-06-28 LAB — LIPASE, BLOOD: Lipase: 31 U/L (ref 11–51)

## 2018-06-28 LAB — PROTIME-INR
INR: 1.1
Prothrombin Time: 14.1 seconds (ref 11.4–15.2)

## 2018-06-28 MED ORDER — FENTANYL CITRATE (PF) 100 MCG/2ML IJ SOLN
50.0000 ug | Freq: Once | INTRAMUSCULAR | Status: AC
  Administered 2018-06-28: 50 ug via INTRAVENOUS
  Filled 2018-06-28: qty 2

## 2018-06-28 MED ORDER — ASPIRIN 81 MG PO CHEW
324.0000 mg | CHEWABLE_TABLET | Freq: Once | ORAL | Status: AC
  Administered 2018-06-28: 324 mg via ORAL
  Filled 2018-06-28: qty 4

## 2018-06-28 MED ORDER — IOPAMIDOL (ISOVUE-370) INJECTION 76%
INTRAVENOUS | Status: AC
  Filled 2018-06-28: qty 100

## 2018-06-28 MED ORDER — IOPAMIDOL (ISOVUE-370) INJECTION 76%
100.0000 mL | Freq: Once | INTRAVENOUS | Status: AC | PRN
  Administered 2018-06-28: 100 mL via INTRAVENOUS
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
## 2018-06-28 NOTE — Progress Notes (Signed)
*  Preliminary Results* Left lower extremity venous duplex completed. Left lower extremity is negative for deep vein thrombosis. There is no evidence of left Baker's cyst.  06/28/2018 6:50 PM  Gertie FeyMichelle Shonette Rhames, BS, RVT, RDCS, RDMS
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
## 2018-06-28 NOTE — ED Triage Notes (Signed)
Pt states that he woke up around 4 this morning with CP, shaking, and SOB. Pt also c/o left foot swelling X 1 week. Pt reports pain woke him up from his sleep, he has had trouble sleeping for the last week.

## 2018-06-28 NOTE — ED Notes (Signed)
Pt given sandwich and Sprite per request. Pt also provided a urinal and informed for need of urine sample

## 2018-06-29 DIAGNOSIS — I249 Acute ischemic heart disease, unspecified: Secondary | ICD-10-CM | POA: Diagnosis present

## 2018-06-29 DIAGNOSIS — R079 Chest pain, unspecified: Secondary | ICD-10-CM

## 2018-06-29 DIAGNOSIS — R072 Precordial pain: Secondary | ICD-10-CM

## 2018-06-29 DIAGNOSIS — R0789 Other chest pain: Secondary | ICD-10-CM

## 2018-06-29 LAB — MRSA PCR SCREENING: MRSA by PCR: NEGATIVE

## 2018-06-29 LAB — TROPONIN I
Troponin I: <0.03 ng/mL (ref <0.03)
Troponin I: <0.03 ng/mL (ref <0.03)

## 2018-06-29 LAB — HEPARIN LEVEL (UNFRACTIONATED): Heparin Unfractionated: 0.55 IU/mL (ref 0.30–0.70)

## 2018-06-29 MED ORDER — TRAMADOL HCL 50 MG PO TABS: 50.0000 mg | ORAL_TABLET | Freq: Four times a day (QID) | ORAL | Status: DC | PRN

## 2018-06-29 MED ORDER — ONDANSETRON HCL 4 MG PO TABS: 4.0000 mg | ORAL_TABLET | Freq: Four times a day (QID) | ORAL | Status: DC | PRN

## 2018-06-29 MED ORDER — ONDANSETRON HCL 4 MG/2ML IJ SOLN: 4.0000 mg | Freq: Four times a day (QID) | INTRAMUSCULAR | Status: DC | PRN

## 2018-06-29 MED ORDER — MORPHINE SULFATE (PF) 4 MG/ML IV SOLN
2.0000 mg | INTRAVENOUS | Status: DC | PRN
  Administered 2018-06-29: 2 mg via INTRAVENOUS
  Filled 2018-06-29: qty 1

## 2018-06-29 MED ORDER — HEPARIN BOLUS VIA INFUSION
4000.0000 [IU] | Freq: Once | INTRAVENOUS | Status: AC
  Administered 2018-06-29: 4000 [IU] via INTRAVENOUS
  Filled 2018-06-29: qty 4000

## 2018-06-29 MED ORDER — ACETAMINOPHEN 650 MG RE SUPP: 650.0000 mg | Freq: Four times a day (QID) | RECTAL | Status: DC | PRN

## 2018-06-29 MED ORDER — ACETAMINOPHEN 325 MG PO TABS: 650.0000 mg | ORAL_TABLET | Freq: Four times a day (QID) | ORAL | Status: DC | PRN

## 2018-06-29 MED ORDER — DIAZEPAM 5 MG PO TABS: 5.0000 mg | ORAL_TABLET | Freq: Four times a day (QID) | ORAL | Status: DC | PRN

## 2018-06-29 MED ORDER — ENSURE ENLIVE PO LIQD: 237.0000 mL | Freq: Two times a day (BID) | ORAL | Status: DC

## 2018-06-29 MED ORDER — OXYCODONE HCL 5 MG PO TABS
5.0000 mg | ORAL_TABLET | ORAL | Status: DC | PRN
  Administered 2018-06-29: 5 mg via ORAL
  Filled 2018-06-29: qty 1

## 2018-06-29 MED ORDER — NITROGLYCERIN 0.2 MG/HR TD PT24
0.2000 mg | MEDICATED_PATCH | Freq: Every day | TRANSDERMAL | Status: DC
Start: 2018-06-29 — End: 2018-06-29
  Filled 2018-06-29: qty 1

## 2018-06-29 MED ORDER — KETOROLAC TROMETHAMINE 30 MG/ML IJ SOLN
30.0000 mg | Freq: Four times a day (QID) | INTRAMUSCULAR | Status: DC | PRN
  Administered 2018-06-29: 30 mg via INTRAVENOUS
  Filled 2018-06-29: qty 1

## 2018-06-29 MED ORDER — ALBUTEROL SULFATE (2.5 MG/3ML) 0.083% IN NEBU
2.5000 mg | INHALATION_SOLUTION | Freq: Four times a day (QID) | RESPIRATORY_TRACT | Status: DC
  Filled 2018-06-29: qty 3

## 2018-06-29 MED ORDER — CLONAZEPAM 0.5 MG PO TABS: 0.5000 mg | ORAL_TABLET | Freq: Every day | ORAL | Status: DC

## 2018-06-29 MED ORDER — OXYCODONE HCL 5 MG PO TABS: 5.0000 mg | ORAL_TABLET | ORAL | Status: DC | PRN

## 2018-06-29 MED ORDER — HEPARIN (PORCINE) IN NACL 100-0.45 UNIT/ML-% IJ SOLN
1400.0000 [IU]/h | INTRAMUSCULAR | Status: DC
  Administered 2018-06-29: 1400 [IU]/h via INTRAVENOUS
  Filled 2018-06-29: qty 250

## 2018-06-29 MED ORDER — IPRATROPIUM-ALBUTEROL 0.5-2.5 (3) MG/3ML IN SOLN: 3.0000 mL | Freq: Four times a day (QID) | RESPIRATORY_TRACT | Status: DC | PRN

## 2018-06-29 MED ORDER — IPRATROPIUM BROMIDE 0.02 % IN SOLN
0.5000 mg | Freq: Four times a day (QID) | RESPIRATORY_TRACT | Status: DC
  Filled 2018-06-29: qty 2.5
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
## 2018-06-29 NOTE — Progress Notes (Signed)
Pt refused to give urine sample for ordered urinalysis. RN encouraged pt to reconsider.
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
## 2018-06-29 NOTE — Progress Notes (Signed)
Nutrition Brief Note  Patient identified on the Malnutrition Screening Tool (MST) Report. Patient reports weight of 230-235 lbs a few months ago. He has been working out in the sun / heat and thinks this is why he lost weight. Nutrition focused physical exam completed.  No muscle or subcutaneous fat depletion noticed.   Wt Readings from Last 15 Encounters:  06/29/18 222 lb 11.2 oz (101 kg)  10/04/16 225 lb (102.1 kg)  01/05/16 220 lb (99.8 kg)  02/21/15 215 lb (97.5 kg)  11/08/14 225 lb (102.1 kg)  01/21/14 227 lb (103 kg)  12/31/13 225 lb 12.8 oz (102.4 kg)  12/14/13 225 lb (102.1 kg)  10/18/13 220 lb (99.8 kg)  09/13/13 220 lb (99.8 kg)    Body mass index is 31.06 kg/m. Patient meets criteria for obesity based on current BMI.   Current diet order is heart healthy, patient is consuming approximately 100% of meals at this time. Labs and medications reviewed.   No nutrition interventions warranted at this time. If nutrition issues arise, please consult RD.   Joaquin CourtsKimberly Harris, RD, LDN, CNSC Pager [phone] After Hours Pager [phone]
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
## 2018-06-29 NOTE — ED Notes (Signed)
PAGED DR March RummageHAJAZI TO Drema PrySHAYLA, RN
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
## 2018-06-29 NOTE — Progress Notes (Signed)
Patient educated about the need for a urine sample. Patient stated he understood.
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
## 2018-06-30 LAB — HIV ANTIBODY (ROUTINE TESTING W REFLEX): HIV SCREEN 4TH GENERATION: NONREACTIVE

## 2019-01-27 DIAGNOSIS — R6510 Systemic inflammatory response syndrome (SIRS) of non-infectious origin without acute organ dysfunction: Secondary | ICD-10-CM

## 2019-01-27 HISTORY — DX: Systemic inflammatory response syndrome (sirs) of non-infectious origin without acute organ dysfunction: R65.10

## 2019-02-07 ENCOUNTER — Inpatient Hospital Stay (HOSPITAL_COMMUNITY)
Admission: EM | Admit: 2019-02-07 | Discharge: 2019-02-13 | DRG: 445 | Disposition: A | Discharge status: Other Acute Inpatient Hospital | Payer: Self-pay | Attending: Internal Medicine | Admitting: Internal Medicine

## 2019-02-07 DIAGNOSIS — R45851 Suicidal ideations: Secondary | ICD-10-CM

## 2019-02-07 DIAGNOSIS — D649 Anemia, unspecified: Secondary | ICD-10-CM | POA: Diagnosis present

## 2019-02-07 DIAGNOSIS — Z880 Allergy status to penicillin: Secondary | ICD-10-CM

## 2019-02-07 DIAGNOSIS — Z888 Allergy status to other drugs, medicaments and biological substances status: Secondary | ICD-10-CM

## 2019-02-07 DIAGNOSIS — F1110 Opioid abuse, uncomplicated: Secondary | ICD-10-CM | POA: Diagnosis present

## 2019-02-07 DIAGNOSIS — F322 Major depressive disorder, single episode, severe without psychotic features: Secondary | ICD-10-CM

## 2019-02-07 DIAGNOSIS — R945 Abnormal results of liver function studies: Secondary | ICD-10-CM

## 2019-02-07 DIAGNOSIS — K819 Cholecystitis, unspecified: Secondary | ICD-10-CM

## 2019-02-07 DIAGNOSIS — R7989 Other specified abnormal findings of blood chemistry: Secondary | ICD-10-CM | POA: Diagnosis present

## 2019-02-07 DIAGNOSIS — N179 Acute kidney failure, unspecified: Secondary | ICD-10-CM | POA: Diagnosis present

## 2019-02-07 DIAGNOSIS — B182 Chronic viral hepatitis C: Secondary | ICD-10-CM | POA: Diagnosis present

## 2019-02-07 DIAGNOSIS — R6510 Systemic inflammatory response syndrome (SIRS) of non-infectious origin without acute organ dysfunction: Secondary | ICD-10-CM | POA: Diagnosis present

## 2019-02-07 DIAGNOSIS — Z886 Allergy status to analgesic agent status: Secondary | ICD-10-CM

## 2019-02-07 DIAGNOSIS — Z634 Disappearance and death of family member: Secondary | ICD-10-CM

## 2019-02-07 DIAGNOSIS — K820 Obstruction of gallbladder: Principal | ICD-10-CM | POA: Diagnosis present

## 2019-02-07 DIAGNOSIS — R739 Hyperglycemia, unspecified: Secondary | ICD-10-CM | POA: Diagnosis present

## 2019-02-07 DIAGNOSIS — E876 Hypokalemia: Secondary | ICD-10-CM | POA: Diagnosis present

## 2019-02-07 HISTORY — DX: Systemic inflammatory response syndrome (sirs) of non-infectious origin without acute organ dysfunction: R65.10

## 2019-02-07 HISTORY — DX: Suicidal ideations: R45.851

## 2019-02-07 LAB — CBC
HCT: 35.9 % — ABNORMAL LOW (ref 39.0–52.0)
Hemoglobin: 11.4 g/dL — ABNORMAL LOW (ref 13.0–17.0)
MCH: 24.9 pg — AB (ref 26.0–34.0)
MCHC: 31.8 g/dL (ref 30.0–36.0)
MCV: 78.6 fL — ABNORMAL LOW (ref 80.0–100.0)
PLATELETS: 286 10*3/uL (ref 150–400)
RBC: 4.57 MIL/uL (ref 4.22–5.81)
RDW: 12.8 % (ref 11.5–15.5)
WBC: 11.4 10*3/uL — ABNORMAL HIGH (ref 4.0–10.5)
nRBC: 0 % (ref 0.0–0.2)

## 2019-02-07 LAB — COMPREHENSIVE METABOLIC PANEL
ALK PHOS: 118 U/L (ref 38–126)
ALT: 691 U/L — ABNORMAL HIGH (ref 0–44)
ANION GAP: 17 — AB (ref 5–15)
AST: 793 U/L — ABNORMAL HIGH (ref 15–41)
Albumin: 3.5 g/dL (ref 3.5–5.0)
BILIRUBIN TOTAL: 1.9 mg/dL — AB (ref 0.3–1.2)
BUN: 7 mg/dL (ref 6–20)
CALCIUM: 8.4 mg/dL — AB (ref 8.9–10.3)
CO2: 24 mmol/L (ref 22–32)
Chloride: 86 mmol/L — ABNORMAL LOW (ref 98–111)
Creatinine, Ser: 1.49 mg/dL — ABNORMAL HIGH (ref 0.61–1.24)
GFR calc non Af Amer: 54 mL/min — ABNORMAL LOW (ref >60)
Glucose, Bld: 177 mg/dL — ABNORMAL HIGH (ref 70–99)
Potassium: 3.1 mmol/L — ABNORMAL LOW (ref 3.5–5.1)
Sodium: 127 mmol/L — ABNORMAL LOW (ref 135–145)
TOTAL PROTEIN: 7.7 g/dL (ref 6.5–8.1)

## 2019-02-07 LAB — ETHANOL

## 2019-02-07 LAB — ACETAMINOPHEN LEVEL

## 2019-02-07 LAB — SALICYLATE LEVEL

## 2019-02-07 NOTE — ED Triage Notes (Signed)
Pt presents with suicidal thoughts, no specific plan. He has been feeling this way "for a while" Has never been seen before for this. Also reports ETOH use and drug use. Last drink yesterday, last used Heroin earlier today.

## 2019-02-08 ENCOUNTER — Inpatient Hospital Stay (HOSPITAL_COMMUNITY): Payer: Self-pay

## 2019-02-08 ENCOUNTER — Emergency Department (HOSPITAL_COMMUNITY): Payer: Self-pay

## 2019-02-08 ENCOUNTER — Other Ambulatory Visit: Payer: Self-pay

## 2019-02-08 ENCOUNTER — Encounter (HOSPITAL_COMMUNITY): Payer: Self-pay | Admitting: Internal Medicine

## 2019-02-08 DIAGNOSIS — R945 Abnormal results of liver function studies: Secondary | ICD-10-CM

## 2019-02-08 DIAGNOSIS — R45851 Suicidal ideations: Secondary | ICD-10-CM

## 2019-02-08 DIAGNOSIS — F322 Major depressive disorder, single episode, severe without psychotic features: Secondary | ICD-10-CM

## 2019-02-08 DIAGNOSIS — R7989 Other specified abnormal findings of blood chemistry: Secondary | ICD-10-CM | POA: Diagnosis present

## 2019-02-08 DIAGNOSIS — K819 Cholecystitis, unspecified: Secondary | ICD-10-CM

## 2019-02-08 DIAGNOSIS — R6510 Systemic inflammatory response syndrome (SIRS) of non-infectious origin without acute organ dysfunction: Secondary | ICD-10-CM

## 2019-02-08 HISTORY — DX: Suicidal ideations: R45.851

## 2019-02-08 LAB — CBC WITH DIFFERENTIAL/PLATELET
Abs Immature Granulocytes: 0.1 10*3/uL — ABNORMAL HIGH (ref 0.00–0.07)
Basophils Absolute: 0.1 10*3/uL (ref 0.0–0.1)
Basophils Relative: 2 %
Eosinophils Absolute: 0.2 10*3/uL (ref 0.0–0.5)
Eosinophils Relative: 3 %
HCT: 29.9 % — ABNORMAL LOW (ref 39.0–52.0)
Hemoglobin: 9.6 g/dL — ABNORMAL LOW (ref 13.0–17.0)
Lymphocytes Relative: 14 %
Lymphs Abs: 1 10*3/uL (ref 0.7–4.0)
MCH: 26 pg (ref 26.0–34.0)
MCHC: 32.1 g/dL (ref 30.0–36.0)
MCV: 81 fL (ref 80.0–100.0)
Monocytes Absolute: 0.3 10*3/uL (ref 0.1–1.0)
Monocytes Relative: 5 %
Myelocytes: 1 %
NEUTROS ABS: 5.2 10*3/uL (ref 1.7–7.7)
NRBC: 0 /100{WBCs}
Neutrophils Relative %: 75 %
Platelets: 196 10*3/uL (ref 150–400)
RBC: 3.69 MIL/uL — ABNORMAL LOW (ref 4.22–5.81)
RDW: 13.1 % (ref 11.5–15.5)
WBC: 6.9 10*3/uL (ref 4.0–10.5)
nRBC: 0 % (ref 0.0–0.2)

## 2019-02-08 LAB — BASIC METABOLIC PANEL
Anion gap: 9 (ref 5–15)
BUN: 6 mg/dL (ref 6–20)
CO2: 28 mmol/L (ref 22–32)
Calcium: 7.6 mg/dL — ABNORMAL LOW (ref 8.9–10.3)
Chloride: 98 mmol/L (ref 98–111)
Creatinine, Ser: 1.18 mg/dL (ref 0.61–1.24)
GFR calc Af Amer: >60 mL/min (ref >60)
GFR calc non Af Amer: >60 mL/min (ref >60)
GLUCOSE: 172 mg/dL — AB (ref 70–99)
Potassium: 3.1 mmol/L — ABNORMAL LOW (ref 3.5–5.1)
Sodium: 135 mmol/L (ref 135–145)

## 2019-02-08 LAB — HEPATIC FUNCTION PANEL
ALT: 809 U/L — ABNORMAL HIGH (ref 0–44)
AST: 994 U/L — ABNORMAL HIGH (ref 15–41)
Albumin: 2.9 g/dL — ABNORMAL LOW (ref 3.5–5.0)
Alkaline Phosphatase: 95 U/L (ref 38–126)
Bilirubin, Direct: 1 mg/dL — ABNORMAL HIGH (ref 0.0–0.2)
Indirect Bilirubin: 0.7 mg/dL (ref 0.3–0.9)
Total Bilirubin: 1.7 mg/dL — ABNORMAL HIGH (ref 0.3–1.2)
Total Protein: 6.1 g/dL — ABNORMAL LOW (ref 6.5–8.1)

## 2019-02-08 LAB — RAPID URINE DRUG SCREEN, HOSP PERFORMED
Amphetamines: NOT DETECTED
Barbiturates: NOT DETECTED
Benzodiazepines: NOT DETECTED
COCAINE: NOT DETECTED
OPIATES: POSITIVE — AB
Tetrahydrocannabinol: NOT DETECTED

## 2019-02-08 LAB — ACETAMINOPHEN LEVEL

## 2019-02-08 LAB — LIPASE, BLOOD
LIPASE: 30 U/L (ref 11–51)
Lipase: 30 U/L (ref 11–51)

## 2019-02-08 LAB — GLUCOSE, CAPILLARY: Glucose-Capillary: 101 mg/dL — ABNORMAL HIGH (ref 70–99)

## 2019-02-08 LAB — CBG MONITORING, ED: Glucose-Capillary: 106 mg/dL — ABNORMAL HIGH (ref 70–99)

## 2019-02-08 LAB — HIV ANTIBODY (ROUTINE TESTING W REFLEX): HIV Screen 4th Generation wRfx: NONREACTIVE

## 2019-02-08 LAB — MAGNESIUM: Magnesium: 1.7 mg/dL (ref 1.7–2.4)

## 2019-02-08 MED ORDER — ONDANSETRON HCL 4 MG/2ML IJ SOLN
4.0000 mg | Freq: Four times a day (QID) | INTRAMUSCULAR | Status: DC | PRN
  Administered 2019-02-10 (×3): 4 mg via INTRAVENOUS
  Filled 2019-02-08 (×4): qty 2

## 2019-02-08 MED ORDER — SODIUM CHLORIDE 0.9 % IV SOLN
INTRAVENOUS | Status: DC
  Administered 2019-02-08 – 2019-02-09 (×2): via INTRAVENOUS
  Filled 2019-02-08 (×9): qty 1000

## 2019-02-08 MED ORDER — IBUPROFEN 400 MG PO TABS
600.0000 mg | ORAL_TABLET | Freq: Once | ORAL | Status: AC
  Administered 2019-02-08: 600 mg via ORAL
  Filled 2019-02-08: qty 1

## 2019-02-08 MED ORDER — LORAZEPAM 2 MG/ML IJ SOLN: 0.0000 mg | Freq: Two times a day (BID) | INTRAMUSCULAR | Status: DC

## 2019-02-08 MED ORDER — SODIUM CHLORIDE 0.9 % IV BOLUS
2000.0000 mL | Freq: Once | INTRAVENOUS | Status: AC
  Administered 2019-02-08: 2000 mL via INTRAVENOUS

## 2019-02-08 MED ORDER — LORAZEPAM 2 MG/ML IJ SOLN: 0.0000 mg | Freq: Four times a day (QID) | INTRAMUSCULAR | Status: DC

## 2019-02-08 MED ORDER — CIPROFLOXACIN IN D5W 400 MG/200ML IV SOLN
400.0000 mg | Freq: Once | INTRAVENOUS | Status: AC
  Administered 2019-02-08: 400 mg via INTRAVENOUS
  Filled 2019-02-08: qty 200

## 2019-02-08 MED ORDER — ONDANSETRON HCL 4 MG PO TABS
4.0000 mg | ORAL_TABLET | Freq: Four times a day (QID) | ORAL | Status: DC | PRN
  Filled 2019-02-08: qty 1

## 2019-02-08 MED ORDER — POTASSIUM CHLORIDE 10 MEQ/100ML IV SOLN
10.0000 meq | INTRAVENOUS | Status: AC
  Administered 2019-02-08 (×3): 10 meq via INTRAVENOUS
  Filled 2019-02-08 (×2): qty 100

## 2019-02-08 MED ORDER — SODIUM CHLORIDE 0.9 % IV BOLUS: 1000.0000 mL | Freq: Once | INTRAVENOUS | Status: DC

## 2019-02-08 MED ORDER — CIPROFLOXACIN IN D5W 400 MG/200ML IV SOLN
400.0000 mg | Freq: Two times a day (BID) | INTRAVENOUS | Status: DC
  Administered 2019-02-08 – 2019-02-09 (×2): 400 mg via INTRAVENOUS
  Filled 2019-02-08 (×2): qty 200

## 2019-02-08 MED ORDER — IBUPROFEN 600 MG PO TABS
600.0000 mg | ORAL_TABLET | Freq: Four times a day (QID) | ORAL | Status: DC | PRN
  Administered 2019-02-08 – 2019-02-13 (×4): 600 mg via ORAL
  Filled 2019-02-08 (×5): qty 1

## 2019-02-08 MED ORDER — VITAMIN B-1 100 MG PO TABS: 100.0000 mg | ORAL_TABLET | Freq: Every day | ORAL | Status: DC

## 2019-02-08 MED ORDER — METRONIDAZOLE IN NACL 5-0.79 MG/ML-% IV SOLN
500.0000 mg | Freq: Three times a day (TID) | INTRAVENOUS | Status: DC
  Administered 2019-02-08 – 2019-02-09 (×4): 500 mg via INTRAVENOUS
  Filled 2019-02-08 (×4): qty 100

## 2019-02-08 MED ORDER — LORAZEPAM 2 MG/ML IJ SOLN
1.0000 mg | INTRAMUSCULAR | Status: DC | PRN
  Administered 2019-02-08 – 2019-02-09 (×4): 1 mg via INTRAVENOUS
  Filled 2019-02-08 (×4): qty 1

## 2019-02-08 MED ORDER — LORAZEPAM 1 MG PO TABS: 0.0000 mg | ORAL_TABLET | Freq: Two times a day (BID) | ORAL | Status: DC

## 2019-02-08 MED ORDER — SODIUM CHLORIDE 0.9 % IV SOLN
INTRAVENOUS | Status: DC
  Administered 2019-02-08 (×2): via INTRAVENOUS

## 2019-02-08 MED ORDER — TECHNETIUM TC 99M MEBROFENIN IV KIT: 5.0000 | PACK | Freq: Once | INTRAVENOUS | Status: DC | PRN

## 2019-02-08 MED ORDER — THIAMINE HCL 100 MG/ML IJ SOLN: 100.0000 mg | Freq: Every day | INTRAMUSCULAR | Status: DC

## 2019-02-08 MED ORDER — LORAZEPAM 1 MG PO TABS: 0.0000 mg | ORAL_TABLET | Freq: Four times a day (QID) | ORAL | Status: DC

## 2019-02-08 NOTE — ED Notes (Addendum)
Pt back from [place] scan at this time
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
## 2019-02-08 NOTE — Progress Notes (Signed)
Patient is a 48 year old male with past medical history of polysubstance abuse presents to the emergency department with suicidal ideation.  Patient also complained of right upper abdominal pain.  Reported using heroin.  Denies taking Tylenol.  Abdomen pain was there for last 3 days. He lost his mother in November last year and since then he is depressed.  He lives with his wife.  He thought about hurting himself but has not tried yet.  No history of depression in the past.  Not taking any medications at home.  Does not follow with psychiatrist. When patient presented, he was found to be febrile, tachycardic.  Found to have elevated liver enzymes and also acute kidney injury.  He has right upper quadrant tenderness.  Ultrasound of the abdomen was done and was suggestive of acute cholecystitis.  No cholelithiasis seen. General surgery consulted.  Currently he is n.p.o.  General surgery has requested HIDA scan. Sitter present at the bedside.  Psychiatry consulted this morning.  Also started on antibiotics. Patient seen and examined the bedside.  Currently he is hemodynamically stable.  Potassium was found to be low this morning so he will be supplemented with potassium.  Continue IV fluids.  Continue pain management.  We will follow-up further plan from general surgery. Patient seen by Dr. Toniann Fail this morning.
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
## 2019-02-08 NOTE — ED Notes (Signed)
Pt going to hida scan
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
## 2019-02-08 NOTE — ED Notes (Signed)
Patient transported to Ultrasound 

## 2019-02-08 NOTE — Progress Notes (Signed)
Pharmacy Antibiotic Note  Aaron [name] is a 48 y.o. male admitted on 02/07/2019 with concern for acalculous cholecystitis.  Pharmacy has been consulted for Cipro dosing.  Plan: Cipro 400mg  IV Q12H. Flagyl also ordered by MD.  Temp (24hrs), Avg:99.5 F (37.5 C), Min:98.4 F (36.9 C), Max:100.5 F (38.1 C)  Recent Labs  Lab 02/07/19 2133  WBC 11.4*  CREATININE 1.49*     Allergies  Allergen Reactions  . Penicillins Swelling    Has patient had a PCN reaction causing immediate rash, facial/tongue/throat swelling, SOB or lightheadedness with hypotension: No Has patient had a PCN reaction causing severe rash involving mucus membranes or skin necrosis: Yes Has patient had a PCN reaction that required hospitalization: No Has patient had a PCN reaction occurring within the last 10 years: No If all of the above answers are "NO", then may proceed with Cephalosporin use.   Aaron [name]. Flexeril [Cyclobenzaprine] Other (See Comments)    Jerking motions  . Flexeril [Cyclobenzaprine] Other (See Comments)    Cannot control muscle movements, causes jerking  . Hysingla Er [Hydrocodone Bitartrate Er] Other (See Comments)    Freaks me out  . Vimovo [Naproxen-Esomeprazole]     GI Upset     Thank you for allowing pharmacy to be a part of this patient's care.  Aaron [name], PharmD, BCPS  02/08/2019 4:44 AM

## 2019-02-09 LAB — HEPATITIS PANEL, ACUTE
HCV Ab: 6.5 s/co ratio — ABNORMAL HIGH (ref 0.0–0.9)
HEP B S AG: NEGATIVE
Hep A IgM: NEGATIVE
Hep B C IgM: NEGATIVE

## 2019-02-09 LAB — COMPREHENSIVE METABOLIC PANEL
ALT: 1525 U/L — ABNORMAL HIGH (ref 0–44)
AST: 1538 U/L — ABNORMAL HIGH (ref 15–41)
Albumin: 2.5 g/dL — ABNORMAL LOW (ref 3.5–5.0)
Alkaline Phosphatase: 109 U/L (ref 38–126)
Anion gap: 7 (ref 5–15)
BILIRUBIN TOTAL: 2.7 mg/dL — AB (ref 0.3–1.2)
BUN: <5 mg/dL — ABNORMAL LOW (ref 6–20)
CO2: 25 mmol/L (ref 22–32)
Calcium: 7.8 mg/dL — ABNORMAL LOW (ref 8.9–10.3)
Chloride: 106 mmol/L (ref 98–111)
Creatinine, Ser: 0.96 mg/dL (ref 0.61–1.24)
GFR calc Af Amer: >60 mL/min (ref >60)
GFR calc non Af Amer: >60 mL/min (ref >60)
Glucose, Bld: 176 mg/dL — ABNORMAL HIGH (ref 70–99)
Potassium: 3.3 mmol/L — ABNORMAL LOW (ref 3.5–5.1)
Sodium: 138 mmol/L (ref 135–145)
TOTAL PROTEIN: 5.7 g/dL — AB (ref 6.5–8.1)

## 2019-02-09 LAB — HEMOGLOBIN A1C
Hgb A1c MFr Bld: 7.1 % — ABNORMAL HIGH (ref 4.8–5.6)
Mean Plasma Glucose: 157 mg/dL

## 2019-02-09 LAB — GLUCOSE, CAPILLARY
Glucose-Capillary: 111 mg/dL — ABNORMAL HIGH (ref 70–99)
Glucose-Capillary: 140 mg/dL — ABNORMAL HIGH (ref 70–99)

## 2019-02-09 MED ORDER — POTASSIUM CHLORIDE IN NACL 40-0.9 MEQ/L-% IV SOLN
INTRAVENOUS | Status: DC
  Administered 2019-02-09: 125 mL/h via INTRAVENOUS
  Filled 2019-02-09: qty 1000

## 2019-02-09 MED ORDER — HALOPERIDOL LACTATE 5 MG/ML IJ SOLN: 2.0000 mg | Freq: Four times a day (QID) | INTRAMUSCULAR | Status: DC | PRN

## 2019-02-09 MED ORDER — ALUM & MAG HYDROXIDE-SIMETH 200-200-20 MG/5ML PO SUSP
15.0000 mL | Freq: Four times a day (QID) | ORAL | Status: DC | PRN
  Administered 2019-02-09 – 2019-02-10 (×2): 15 mL via ORAL
  Filled 2019-02-09 (×2): qty 30

## 2019-02-09 MED ORDER — ENOXAPARIN SODIUM 40 MG/0.4ML ~~LOC~~ SOLN
40.0000 mg | SUBCUTANEOUS | Status: DC
  Administered 2019-02-09 – 2019-02-13 (×4): 40 mg via SUBCUTANEOUS
  Filled 2019-02-09 (×4): qty 0.4

## 2019-02-09 MED ORDER — LORAZEPAM 2 MG/ML IJ SOLN
1.0000 mg | INTRAMUSCULAR | Status: DC | PRN
  Administered 2019-02-09 – 2019-02-13 (×20): 2 mg via INTRAVENOUS
  Filled 2019-02-09 (×20): qty 1

## 2019-02-09 MED ORDER — MORPHINE SULFATE (PF) 2 MG/ML IV SOLN
2.0000 mg | Freq: Once | INTRAVENOUS | Status: AC
  Administered 2019-02-09: 2 mg via INTRAVENOUS
  Filled 2019-02-09: qty 1

## 2019-02-09 NOTE — Progress Notes (Signed)
   02/09/19 1500  Clinical Encounter Type  Visited With Patient  Visit Type Initial  Referral From Nurse  Consult/Referral To Chaplain  Spiritual Encounters  Spiritual Needs Emotional;Prayer  Stress Factors  Patient Stress Factors Exhausted;Family relationships;Lack of caregivers;Loss of control   Responded to spiritual care consult. Upon entering PT had a sitter at bedside. PT woke up after calling his name. PT stated he was withdrawing from his drug use. He stated that while his time here that his questions were not answered by the psychiatrist visit. He stated that he wants help with his drug use and depression. He wanted clarity on what options there are for him. He was tearful and thankful for the Chaplain visit. I offered spiritual care with words of comfort, empathic listening and prayer. Chaplain available as needed. Chaplain Orest Dikes  [phone]

## 2019-02-09 NOTE — Progress Notes (Signed)
Patient ID: Aaron [name], male   DOB: March 25, 1971, 48 y.o.   MRN: [id number]  PROGRESS NOTE    Aaron Soric N Bellard  WGN:[id number] DOB: March 25, 1971 DOA: 02/07/2019 PCP: Quentin Mullingollier, Amanda, PA-C   Brief Narrative:  48 year old male with history of polysubstance abuse presented with suicidal ideation.  He was found to be febrile, tachycardic with elevated LFTs and there was question for cholecystitis.  General surgery was consulted.  HIDA scan was negative for cholecystitis.  General surgery has signed off.  Psychiatry was also consulted.  Psychiatry recommended inpatient psychiatric hospitalization.   Assessment & Plan:   Principal Problem:   MDD (major depressive disorder), single episode, severe , no psychosis (HCC) Active Problems:   SIRS (systemic inflammatory response syndrome) (HCC)   Elevated LFTs   Suicidal ideation  Elevated LFTs -Questionable cause.  Might be drug-induced versus some minor viral hepatitis. -Initially there was a concern for cholecystitis but HIDA scan is negative.  Will discontinue antibiotics.  General surgery has signed off -LFTs are worsening.  Will monitor.  No signs of encephalopathy.  Will check INR tomorrow. -Hepatitis panel is positive for hepatitis C antibody which was positive in the past as well.  Patient probably has chronic untreated hepatitis C.  He will need outpatient follow-up with PCP/GI -Currently no abdominal pain  Acute renal failure -Treated with IV fluids.  Resolved  Hypokalemia -Replace.  Repeat a.m. labs  Polysubstance abuse -Continue counseling  Severe depression with suicidal ideation -Psychiatry consultation appreciated.  Continue Software engineerbedside sitter.  Patient will need inpatient psychiatric hospitalization once medically stable.   DVT prophylaxis: We will start Lovenox Code Status: Full Family Communication: None at bedside [facility] medically [facility]  Consultants: General  surgery/psychiatry  Procedures: None  Antimicrobials: Cipro and Flagyl   Subjective: Patient seen and examined at bedside.  He is a poor historian.  Denies any worsening fever, nausea, vomiting.  Bedside sitter present  Objective: Vitals:   02/08/19 1403 02/08/19 1550 02/08/19 2154 02/09/19 0529  BP: 115/72 126/84 113/75 111/76  Pulse: 64 76 75 69  Resp: 20 15 18 19   Temp: 97.8 F (36.6 C) 98.5 F (36.9 C) 98.3 F (36.8 C) 98.3 F (36.8 C)  TempSrc: Oral Oral Oral Oral  SpO2: 99% 100% 97% 97%  Weight: 94.1 kg     Height: 5\' 11"  (1.803 m)       Intake/Output Summary (Last 24 hours) at 02/09/2019 0952 Last data filed at 02/08/2019 2050 Gross per 24 hour  Intake 2470.53 ml  Output -  Net 2470.53 ml   Filed Weights   02/08/19 1403  Weight: 94.1 kg    Examination:  General exam: Appears calm and comfortable.  Flat affect.  No distress Respiratory system: Bilateral decreased breath sounds at bases Cardiovascular system: S1 & S2 heard, Rate controlled Gastrointestinal system: Abdomen is nondistended, soft and nontender. Normal bowel sounds heard. Extremities: No cyanosis, clubbing, edema    Data Reviewed: I have personally reviewed following labs and imaging studies  CBC: Recent Labs  Lab 02/07/19 2133 02/08/19 0440  WBC 11.4* 6.9  NEUTROABS  --  5.2  HGB 11.4* 9.6*  HCT 35.9* 29.9*  MCV 78.6* 81.0  PLT 286 196   Basic Metabolic Panel: Recent Labs  Lab 02/07/19 2133 02/08/19 0440 02/09/19 0431  NA 127* 135 138  K 3.1* 3.1* 3.3*  CL 86* 98 106  CO2 24 28 25   GLUCOSE 177* 172* 176*  BUN  7 6 <5*  CREATININE 1.49* 1.18 0.96  CALCIUM 8.4* 7.6* 7.8*  MG  --  1.7  --    GFR: Estimated Creatinine Clearance: 109 mL/min (by C-G formula based on SCr of 0.96 mg/dL). Liver Function Tests: Recent Labs  Lab 02/07/19 2133 02/08/19 0440 02/09/19 0431  AST 793* 994* 1,538*  ALT 691* 809* 1,525*  ALKPHOS 118 95 109  BILITOT 1.9* 1.7* 2.7*  PROT 7.7 6.1*  5.7*  ALBUMIN 3.5 2.9* 2.5*   Recent Labs  Lab 02/08/19 0054 02/08/19 0440  LIPASE 30 30   No results for input(s): AMMONIA in the last 168 hours. Coagulation Profile: No results for input(s): INR, PROTIME in the last 168 hours. Cardiac Enzymes: No results for input(s): CKTOTAL, CKMB, CKMBINDEX, TROPONINI in the last 168 hours. BNP (last 3 results) No results for input(s): PROBNP in the last 8760 hours. HbA1C: Recent Labs    02/08/19 0440  HGBA1C 7.1*   CBG: Recent Labs  Lab 02/08/19 0828 02/08/19 1640 02/09/19 0033 02/09/19 0750  GLUCAP 106* 101* 140* 111*   Lipid Profile: No results for input(s): CHOL, HDL, LDLCALC, TRIG, CHOLHDL, LDLDIRECT in the last 72 hours. Thyroid Function Tests: No results for input(s): TSH, T4TOTAL, FREET4, T3FREE, THYROIDAB in the last 72 hours. Anemia Panel: No results for input(s): VITAMINB12, FOLATE, FERRITIN, TIBC, IRON, RETICCTPCT in the last 72 hours. Sepsis Labs: No results for input(s): PROCALCITON, LATICACIDVEN in the last 168 hours.  Recent Results (from the past 240 hour(s))  Culture, blood (Routine X 2) w Reflex to ID Panel     Status: None (Preliminary result)   Collection Time: 02/08/19  1:20 AM  Result Value Ref Range Status   Specimen Description BLOOD LEFT ARM  Final   Special Requests   Final    BOTTLES DRAWN AEROBIC AND ANAEROBIC Blood Culture results may not be optimal due to an excessive volume of blood received in culture bottles   Culture   Final    NO GROWTH 1 DAY Performed at [facility], [address], [place], [place] [address]    Report Status PENDING  Incomplete  Culture, blood (Routine X 2) w Reflex to ID Panel     Status: None (Preliminary result)   Collection Time: 02/08/19  1:22 AM  Result Value Ref Range Status   Specimen Description BLOOD RIGHT ARM  Final   Special Requests   Final    BOTTLES DRAWN AEROBIC AND ANAEROBIC Blood Culture results may not be optimal due to an excessive volume of  blood received in culture bottles   Culture   Final    NO GROWTH 1 DAY Performed at [facility], [address], [place], [place] [address]    Report Status PENDING  Incomplete         Radiology Studies: Nm Hepato W/eject Fract  Result Date: 02/08/2019 CLINICAL DATA:  Abdominal pain with nausea and vomiting EXAM: NUCLEAR MEDICINE HEPATOBILIARY IMAGING WITH GALLBLADDER EF VIEWS: Anterior right upper quadrant RADIOPHARMACEUTICALS:  5.15 mCi Tc-33m  Choletec IV COMPARISON:  None. FINDINGS: Liver uptake of radiotracer is unremarkable. There is prompt visualization of gallbladder and small bowel, indicating patency of the cystic and common bile ducts. The patient consumed 8 ounces of Ensure with calculation of the computer generated ejection fraction of radiotracer from the gallbladder. The patient did not experience clinical symptoms with the oral Ensure consumption. The computer generated ejection fraction of radiotracer from the gallbladder is normal at 76%, normal greater  than 33% using the oral agent. IMPRESSION: Study within normal limits. Electronically Signed   By: Bretta Bang III M.D.   On: 02/08/2019 14:28   US Abdomen Limited  Result Date: 02/08/2019 CLINICAL DATA:  Transaminitis EXAM: ULTRASOUND ABDOMEN LIMITED RIGHT UPPER QUADRANT COMPARISON:  CT 04/11/2008 FINDINGS: Gallbladder: Contracted. No visible stones. Gallbladder wall is thickening, gallbladder wall is thickened, 7 mm. The patient was tender over the gallbladder during the study. Common bile duct: Diameter: Normal caliber, 4 mm Liver: Increased echotexture compatible with fatty infiltration. No focal abnormality or biliary ductal dilatation. Portal vein is patent on color Doppler imaging with normal direction of blood flow towards the liver. IMPRESSION: Fatty infiltration of the liver. Contracted gallbladder with mild gallbladder wall thickening and tenderness over the gallbladder during the study. No visible stones.  Electronically Signed   By: Charlett Nose M.D.   On: 02/08/2019 01:44        Scheduled Meds: Continuous Infusions: . ciprofloxacin 400 mg (02/09/19 0108)  . metronidazole 500 mg (02/09/19 0547)  . sodium chloride 0.9 % 1,000 mL with potassium chloride 40 mEq infusion 125 mL/hr at 02/09/19 0109     LOS: 1 day        Glade Lloyd, MD [facility] Pager [phone]  If 7PM-7AM, please contact night-coverage [email or web address] Password [facility] 02/09/2019, 9:52 AM

## 2019-02-09 NOTE — Care Management Note (Addendum)
Case Management Note  Patient Details  Name: Aaron [name] MRN: [id number] Date of Birth: [birth year]  Subjective/Objective:        Admitted with SI and later found to have acute cholecystitis. Drug usage: snorts heroine. Recent loss of mom, November.             Aaron [name] (Spouse) Aaron [name] (Daughter)     [phone]     PCP: Quentin Mulling  Action/Plan: Inpatient psych bed when medically stable...CSW made aware.Marland KitchenMarland KitchenNCM will continue to monitor for TOC needs.  NCM received consult :Paying for medications   Expected Discharge Date:                  Expected Discharge Plan:  Psychiatric Hospital  In-House Referral:  Clinical Social Work  Discharge planning Services  CM Consult  Post Acute Care Choice:  NA Choice offered to:     DME Arranged:  N/A DME Agency:  NA  HH Arranged:  NA HH Agency:  NA  Status of Service:  In process, will continue to follow  If discussed at Long Length of Stay Meetings, dates discussed:    Additional Comments:  Epifanio Lesches, RN 02/09/2019, 8:46 AM

## 2019-02-10 LAB — COMPREHENSIVE METABOLIC PANEL
ALT: 1438 U/L — AB (ref 0–44)
AST: 962 U/L — ABNORMAL HIGH (ref 15–41)
Albumin: 2.8 g/dL — ABNORMAL LOW (ref 3.5–5.0)
Alkaline Phosphatase: 127 U/L — ABNORMAL HIGH (ref 38–126)
Anion gap: 10 (ref 5–15)
BUN: <5 mg/dL — ABNORMAL LOW (ref 6–20)
CO2: 23 mmol/L (ref 22–32)
Calcium: 8.4 mg/dL — ABNORMAL LOW (ref 8.9–10.3)
Chloride: 104 mmol/L (ref 98–111)
Creatinine, Ser: 1.08 mg/dL (ref 0.61–1.24)
GFR calc Af Amer: >60 mL/min (ref >60)
GFR calc non Af Amer: >60 mL/min (ref >60)
Glucose, Bld: 122 mg/dL — ABNORMAL HIGH (ref 70–99)
Potassium: 3.6 mmol/L (ref 3.5–5.1)
SODIUM: 137 mmol/L (ref 135–145)
Total Bilirubin: 3.6 mg/dL — ABNORMAL HIGH (ref 0.3–1.2)
Total Protein: 6.4 g/dL — ABNORMAL LOW (ref 6.5–8.1)

## 2019-02-10 LAB — CBC WITH DIFFERENTIAL/PLATELET
Abs Immature Granulocytes: 0.03 10*3/uL (ref 0.00–0.07)
Basophils Absolute: 0 10*3/uL (ref 0.0–0.1)
Basophils Relative: 0 %
Eosinophils Absolute: 0.2 10*3/uL (ref 0.0–0.5)
Eosinophils Relative: 3 %
HCT: 34 % — ABNORMAL LOW (ref 39.0–52.0)
Hemoglobin: 10.6 g/dL — ABNORMAL LOW (ref 13.0–17.0)
Immature Granulocytes: 1 %
LYMPHS PCT: 40 %
Lymphs Abs: 2.5 10*3/uL (ref 0.7–4.0)
MCH: 24.5 pg — ABNORMAL LOW (ref 26.0–34.0)
MCHC: 31.2 g/dL (ref 30.0–36.0)
MCV: 78.7 fL — ABNORMAL LOW (ref 80.0–100.0)
Monocytes Absolute: 0.7 10*3/uL (ref 0.1–1.0)
Monocytes Relative: 12 %
Neutro Abs: 2.7 10*3/uL (ref 1.7–7.7)
Neutrophils Relative %: 44 %
Platelets: 227 10*3/uL (ref 150–400)
RBC: 4.32 MIL/uL (ref 4.22–5.81)
RDW: 13.1 % (ref 11.5–15.5)
WBC: 6.1 10*3/uL (ref 4.0–10.5)
nRBC: 0 % (ref 0.0–0.2)

## 2019-02-10 LAB — GLUCOSE, CAPILLARY
Glucose-Capillary: 101 mg/dL — ABNORMAL HIGH (ref 70–99)
Glucose-Capillary: 131 mg/dL — ABNORMAL HIGH (ref 70–99)
Glucose-Capillary: 118 mg/dL — ABNORMAL HIGH (ref 70–99)

## 2019-02-10 LAB — PROTIME-INR
INR: 1.38
Prothrombin Time: 16.9 seconds — ABNORMAL HIGH (ref 11.4–15.2)

## 2019-02-10 LAB — MAGNESIUM: Magnesium: 1.6 mg/dL — ABNORMAL LOW (ref 1.7–2.4)

## 2019-02-10 MED ORDER — METOCLOPRAMIDE HCL 5 MG/ML IJ SOLN
10.0000 mg | Freq: Four times a day (QID) | INTRAMUSCULAR | Status: DC | PRN
  Administered 2019-02-10: 10 mg via INTRAVENOUS
  Filled 2019-02-10: qty 2

## 2019-02-10 MED ORDER — IBUPROFEN 600 MG PO TABS: 600.0000 mg | ORAL_TABLET | Freq: Four times a day (QID) | ORAL | 0 refills | Status: DC | PRN

## 2019-02-10 MED ORDER — PANTOPRAZOLE SODIUM 40 MG PO TBEC
40.0000 mg | DELAYED_RELEASE_TABLET | Freq: Every day | ORAL | Status: DC
  Administered 2019-02-11 – 2019-02-13 (×3): 40 mg via ORAL
  Filled 2019-02-10 (×4): qty 1

## 2019-02-10 MED ORDER — MAGNESIUM SULFATE 2 GM/50ML IV SOLN
2.0000 g | Freq: Once | INTRAVENOUS | Status: AC
  Administered 2019-02-10: 2 g via INTRAVENOUS

## 2019-02-10 MED ORDER — ONDANSETRON HCL 4 MG PO TABS: 4.0000 mg | ORAL_TABLET | Freq: Four times a day (QID) | ORAL | 0 refills | Status: DC | PRN

## 2019-02-10 NOTE — Discharge Summary (Signed)
Physician Discharge Summary  Aaron [name] N Aaron [name]:[id number]:[id number] DOB: 1971-04-26 DOA: 02/07/2019  PCP: Quentin Mullingollier, Amanda, PA-C  Admit date: 02/07/2019 Discharge date: 02/10/2019  Admitted From: Home Disposition: Inpatient psychiatric facility  Recommendations for Outpatient Follow-up:  1. Follow up with provider at inpatient psychiatric facility at earliest convenience 2. Outpatient follow-up with PCP with repeat CMP within a week   Home Health: No Equipment/Devices: None  Discharge Condition: Stable CODE STATUS: Full Diet recommendation: Heart Healthy  Brief/Interim Summary: 48 year old male with history of polysubstance abuse presented with suicidal ideation.  He was found to be febrile, tachycardic with elevated LFTs and there was question for cholecystitis.  General surgery was consulted.  HIDA scan was negative for cholecystitis.  General surgery has signed off.  Psychiatry was also consulted.  Psychiatry recommended inpatient psychiatric hospitalization.  LFTs are improving.  Discharge to inpatient psychiatric facility once bed is available.   Discharge Diagnoses:  Principal Problem:   MDD (major depressive disorder), single episode, severe , no psychosis (HCC) Active Problems:   SIRS (systemic inflammatory response syndrome) (HCC)   Elevated LFTs   Suicidal ideation  Elevated LFTs -Questionable cause.  Might be drug-induced versus some  viral hepatitis. -Initially there was a concern for cholecystitis but HIDA scan is negative.  Antibiotics discontinued.  General surgery has signed off. -LFTs improving.  No signs of encephalopathy.    Outpatient follow-up of LFTs. -Hepatitis panel is positive for hepatitis C antibody which was positive in the past as well.  Patient probably has chronic untreated hepatitis C.  He will need outpatient follow-up with PCP/GI -Currently no abdominal pain  Acute renal failure -Treated with IV fluids.   Resolved  Hypokalemia -Improved.  Polysubstance abuse -Continue counseling  Severe depression with suicidal ideation -Psychiatry consultation appreciated.  Continue Software engineerbedside sitter.  Patient will need inpatient psychiatric hospitalization once bed is available.  Discharge Instructions  Discharge Instructions    Diet - low sodium heart healthy   Complete by:  As directed    Increase activity slowly   Complete by:  As directed      Allergies as of 02/10/2019      Reactions   Penicillins Swelling   Has patient had a PCN reaction causing immediate rash, facial/tongue/throat swelling, SOB or lightheadedness with hypotension: No Has patient had a PCN reaction causing severe rash involving mucus membranes or skin necrosis: Yes Has patient had a PCN reaction that required hospitalization: No Has patient had a PCN reaction occurring within the last 10 years: No If all of the above answers are "NO", then may proceed with Cephalosporin use.   Flexeril [cyclobenzaprine] Other (See Comments)   Jerking motions   Flexeril [cyclobenzaprine] Other (See Comments)   Cannot control muscle movements, causes jerking   Hysingla Er [hydrocodone Bitartrate Er] Other (See Comments)   Freaks me out   Vimovo [naproxen-esomeprazole]    GI Upset      Medication List    STOP taking these medications   acetaminophen 325 MG tablet Commonly known as:  TYLENOL   benzonatate 100 MG capsule Commonly known as:  TESSALON   bisacodyl 10 MG suppository Commonly known as:  DULCOLAX   clonazePAM 0.5 MG tablet Commonly known as:  KLONOPIN   diazepam 5 MG tablet Commonly known as:  VALIUM   DSS 100 MG Caps   methocarbamol 500 MG tablet Commonly known as:  ROBAXIN   Oxycodone HCl 10 MG Tabs   oxyCODONE-acetaminophen 5-325 MG tablet Commonly known as:  PERCOCET/ROXICET  polyethylene glycol packet Commonly known as:  MIRALAX / GLYCOLAX   traMADol 50 MG tablet Commonly known as:  ULTRAM      TAKE these medications   ibuprofen 600 MG tablet Commonly known as:  ADVIL,MOTRIN Take 1 tablet (600 mg total) by mouth every 6 (six) hours as needed for moderate pain. What changed:    medication strength  how much to take   ondansetron 4 MG tablet Commonly known as:  ZOFRAN Take 1 tablet (4 mg total) by mouth every 6 (six) hours as needed for nausea.      Follow-up Information    Quentin Mulling, PA-C. Schedule an appointment as soon as possible for a visit in 1 week(s).   Specialty:  Physician Assistant Why:  With repeat CMP Contact information: [address] [place] [address] [phone]          Allergies  Allergen Reactions  . Penicillins Swelling    Has patient had a PCN reaction causing immediate rash, facial/tongue/throat swelling, SOB or lightheadedness with hypotension: No Has patient had a PCN reaction causing severe rash involving mucus membranes or skin necrosis: Yes Has patient had a PCN reaction that required hospitalization: No Has patient had a PCN reaction occurring within the last 10 years: No If all of the above answers are "NO", then may proceed with Cephalosporin use.   [name] [Cyclobenzaprine] Other (See Comments)    Jerking motions  . Flexeril [Cyclobenzaprine] Other (See Comments)    Cannot control muscle movements, causes jerking  . Hysingla Er [Hydrocodone Bitartrate Er] Other (See Comments)    Freaks me out  . Vimovo [Naproxen-Esomeprazole]     GI Upset    Consultations:  Psychiatry/general surgery   Procedures/Studies: Nm Hepato W/eject Fract  Result Date: 02/08/2019 CLINICAL DATA:  Abdominal pain with nausea and vomiting EXAM: NUCLEAR MEDICINE HEPATOBILIARY IMAGING WITH GALLBLADDER EF VIEWS: Anterior right upper quadrant RADIOPHARMACEUTICALS:  5.15 mCi Tc-1m  Choletec IV COMPARISON:  None. FINDINGS: Liver uptake of radiotracer is unremarkable. There is prompt visualization of gallbladder and small  bowel, indicating patency of the cystic and common bile ducts. The patient consumed 8 ounces of Ensure with calculation of the computer generated ejection fraction of radiotracer from the gallbladder. The patient did not experience clinical symptoms with the oral Ensure consumption. The computer generated ejection fraction of radiotracer from the gallbladder is normal at 76%, normal greater than 33% using the oral agent. IMPRESSION: Study within normal limits. Electronically Signed   By: Bretta Bang III M.D.   On: 02/08/2019 14:28   US Abdomen Limited  Result Date: 02/08/2019 CLINICAL DATA:  Transaminitis EXAM: ULTRASOUND ABDOMEN LIMITED RIGHT UPPER QUADRANT COMPARISON:  CT 04/11/2008 FINDINGS: Gallbladder: Contracted. No visible stones. Gallbladder wall is thickening, gallbladder wall is thickened, 7 mm. The patient was tender over the gallbladder during the study. Common bile duct: Diameter: Normal caliber, 4 mm Liver: Increased echotexture compatible with fatty infiltration. No focal abnormality or biliary ductal dilatation. Portal vein is patent on color Doppler imaging with normal direction of blood flow towards the liver. IMPRESSION: Fatty infiltration of the liver. Contracted gallbladder with mild gallbladder wall thickening and tenderness over the gallbladder during the study. No visible stones. Electronically Signed   By: Charlett Nose M.D.   On: 02/08/2019 01:44   Subjective: Patient seen and examined at bedside.  He is a poor historian.  Flat affect.  Bedside sitter present.  No overnight fever reported.  Has intermittent nausea.  Discharge  Exam: Vitals:   02/09/19 2047 02/10/19 0550  BP: 128/89 (!) 115/57  Pulse: 79 76  Resp: (!) 22 15  Temp: 98.9 F (37.2 C) 98.5 F (36.9 C)  SpO2: 97% 99%   Vitals:   02/09/19 0529 02/09/19 1340 02/09/19 2047 02/10/19 0550  BP: 111/76 135/79 128/89 (!) 115/57  Pulse: 69 90 79 76  Resp: 19 20 (!) 22 15  Temp: 98.3 F (36.8 C) 98.4 F (36.9  C) 98.9 F (37.2 C) 98.5 F (36.9 C)  TempSrc: Oral Oral Oral Oral  SpO2: 97% 100% 97% 99%  Weight:      Height:        General: Pt is alert, awake, not in acute distress.  Poor historian.  Flat affect Cardiovascular: rate controlled, S1/S2 + Respiratory: bilateral decreased breath sounds at bases, no wheezing Abdominal: Soft, NT, ND, bowel sounds + Extremities: no edema, no cyanosis    The results of significant diagnostics from this hospitalization (including imaging, microbiology, ancillary and laboratory) are listed below for reference.     Microbiology: Recent Results (from the past 240 hour(s))  Culture, blood (Routine X 2) w Reflex to ID Panel     Status: None (Preliminary result)   Collection Time: 02/08/19  1:20 AM  Result Value Ref Range Status   Specimen Description BLOOD LEFT ARM  Final   Special Requests   Final    BOTTLES DRAWN AEROBIC AND ANAEROBIC Blood Culture results may not be optimal due to an excessive volume of blood received in culture bottles   Culture   Final    NO GROWTH 2 DAYS Performed at [facility], [address] [place], [place] [address]    Report Status PENDING  Incomplete  Culture, blood (Routine X 2) w Reflex to ID Panel     Status: None (Preliminary result)   Collection Time: 02/08/19  1:22 AM  Result Value Ref Range Status   Specimen Description BLOOD RIGHT ARM  Final   Special Requests   Final    BOTTLES DRAWN AEROBIC AND ANAEROBIC Blood Culture results may not be optimal due to an excessive volume of blood received in culture bottles   Culture   Final    NO GROWTH 2 DAYS Performed at [facility], [address] [place], [place] [address]    Report Status PENDING  Incomplete     Labs: BNP (last 3 results) No results for input(s): BNP in the last 8760 hours. Basic Metabolic Panel: Recent Labs  Lab 02/07/19 2133 02/08/19 0440 02/09/19 0431 02/10/19 0446  NA 127* 135 138 137  K 3.1* 3.1* 3.3* 3.6  CL 86* 98  106 104  CO2 24 28 25 23   GLUCOSE 177* 172* 176* 122*  BUN 7 6 <5* <5*  CREATININE 1.49* 1.18 0.96 1.08  CALCIUM 8.4* 7.6* 7.8* 8.4*  MG  --  1.7  --  1.6*   Liver Function Tests: Recent Labs  Lab 02/07/19 2133 02/08/19 0440 02/09/19 0431 02/10/19 0446  AST 793* 994* 1,538* 962*  ALT 691* 809* 1,525* 1,438*  ALKPHOS 118 95 109 127*  BILITOT 1.9* 1.7* 2.7* 3.6*  PROT 7.7 6.1* 5.7* 6.4*  ALBUMIN 3.5 2.9* 2.5* 2.8*   Recent Labs  Lab 02/08/19 0054 02/08/19 0440  LIPASE 30 30   No results for input(s): AMMONIA in the last 168 hours. CBC: Recent Labs  Lab 02/07/19 2133 02/08/19 0440 02/10/19 0446  WBC 11.4* 6.9 6.1  NEUTROABS  --  5.2 2.7  HGB 11.4* 9.6* 10.6*  HCT 35.9* 29.9* 34.0*  MCV 78.6* 81.0 78.7*  PLT 286 196 227   Cardiac Enzymes: No results for input(s): CKTOTAL, CKMB, CKMBINDEX, TROPONINI in the last 168 hours. BNP: Invalid input(s): POCBNP CBG: Recent Labs  Lab 02/08/19 1640 02/09/19 0033 02/09/19 0750 02/10/19 0735 02/10/19 1202  GLUCAP 101* 140* 111* 101* 131*   D-Dimer No results for input(s): DDIMER in the last 72 hours. Hgb A1c Recent Labs    02/08/19 0440  HGBA1C 7.1*   Lipid Profile No results for input(s): CHOL, HDL, LDLCALC, TRIG, CHOLHDL, LDLDIRECT in the last 72 hours. Thyroid function studies No results for input(s): TSH, T4TOTAL, T3FREE, THYROIDAB in the last 72 hours.  Invalid input(s): FREET3 Anemia work up No results for input(s): VITAMINB12, FOLATE, FERRITIN, TIBC, IRON, RETICCTPCT in the last 72 hours. Urinalysis    Component Value Date/Time   COLORURINE YELLOW 04/11/2008 0700   APPEARANCEUR CLEAR 04/11/2008 0700   LABSPEC 1.025 01/08/2011 2031   PHURINE 5.5 01/08/2011 2031   GLUCOSEU NEGATIVE 04/11/2008 0700   HGBUR NEGATIVE 01/08/2011 2031   BILIRUBINUR NEGATIVE 01/08/2011 2031   KETONESUR NEGATIVE 01/08/2011 2031   PROTEINUR NEGATIVE 01/08/2011 2031   UROBILINOGEN 0.2 01/08/2011 2031   NITRITE NEGATIVE  01/08/2011 2031   LEUKOCYTESUR  01/08/2011 2031    NEGATIVE Biochemical Testing Only. Please order routine urinalysis from main lab if confirmatory testing is needed.   Sepsis Labs Invalid input(s): PROCALCITONIN,  WBC,  LACTICIDVEN Microbiology Recent Results (from the past 240 hour(s))  Culture, blood (Routine X 2) w Reflex to ID Panel     Status: None (Preliminary result)   Collection Time: 02/08/19  1:20 AM  Result Value Ref Range Status   Specimen Description BLOOD LEFT ARM  Final   Special Requests   Final    BOTTLES DRAWN AEROBIC AND ANAEROBIC Blood Culture results may not be optimal due to an excessive volume of blood received in culture bottles   Culture   Final    NO GROWTH 2 DAYS Performed at [facility], [address] [place] [address], [place], [place] [address]    Report Status PENDING  Incomplete  Culture, blood (Routine X 2) w Reflex to ID Panel     Status: None (Preliminary result)   Collection Time: 02/08/19  1:22 AM  Result Value Ref Range Status   Specimen Description BLOOD RIGHT ARM  Final   Special Requests   Final    BOTTLES DRAWN AEROBIC AND ANAEROBIC Blood Culture results may not be optimal due to an excessive volume of blood received in culture bottles   Culture   Final    NO GROWTH 2 DAYS Performed at [facility], [address], [place], [place] [address]    Report Status PENDING  Incomplete     Time coordinating discharge: 35 minutes  SIGNED:   Glade Lloyd, MD  [facility] 02/10/2019, 12:14 PM Pager: [phone]  If 7PM-7AM, please contact night-coverage [email or web address] Password [email or web address]

## 2019-02-10 NOTE — Progress Notes (Signed)
There are no beds available at [facility] or [facility]. Patient is being faxed out to other facilities within state.  CSW will continue to follow for support.   Budd Palmer LCSWA [phone]

## 2019-02-11 LAB — COMPREHENSIVE METABOLIC PANEL
ALT: 1075 U/L — ABNORMAL HIGH (ref 0–44)
AST: 385 U/L — AB (ref 15–41)
Albumin: 3.2 g/dL — ABNORMAL LOW (ref 3.5–5.0)
Alkaline Phosphatase: 165 U/L — ABNORMAL HIGH (ref 38–126)
Anion gap: 7 (ref 5–15)
BUN: 7 mg/dL (ref 6–20)
CHLORIDE: 105 mmol/L (ref 98–111)
CO2: 22 mmol/L (ref 22–32)
Calcium: 8.7 mg/dL — ABNORMAL LOW (ref 8.9–10.3)
Creatinine, Ser: 0.97 mg/dL (ref 0.61–1.24)
GFR calc Af Amer: >60 mL/min (ref >60)
GFR calc non Af Amer: >60 mL/min (ref >60)
Glucose, Bld: 183 mg/dL — ABNORMAL HIGH (ref 70–99)
Potassium: 3.8 mmol/L (ref 3.5–5.1)
Sodium: 134 mmol/L — ABNORMAL LOW (ref 135–145)
Total Bilirubin: 3.2 mg/dL — ABNORMAL HIGH (ref 0.3–1.2)
Total Protein: 7.1 g/dL (ref 6.5–8.1)

## 2019-02-11 LAB — GLUCOSE, CAPILLARY
Glucose-Capillary: 154 mg/dL — ABNORMAL HIGH (ref 70–99)
Glucose-Capillary: 161 mg/dL — ABNORMAL HIGH (ref 70–99)

## 2019-02-11 LAB — CMV IGM

## 2019-02-11 LAB — EPSTEIN-BARR VIRUS VCA, IGM: EBV VCA IgM: <36 U/mL (ref 0.0–35.9)

## 2019-02-11 NOTE — Progress Notes (Signed)
Patient ID: Aaron [name], male   DOB: 05-May-1971, 48 y.o.   MRN: [id number] Patient is waiting to be discharged to inpatient psychiatric facility.  He is medically stable for discharge.  Patient seen at bedside.  LFTs improving.  Please refer to the discharge summary done by me on 02/10/2019 for full details.

## 2019-02-12 LAB — HEPATIC FUNCTION PANEL
ALT: 687 U/L — AB (ref 0–44)
AST: 156 U/L — ABNORMAL HIGH (ref 15–41)
Albumin: 3.2 g/dL — ABNORMAL LOW (ref 3.5–5.0)
Alkaline Phosphatase: 150 U/L — ABNORMAL HIGH (ref 38–126)
Bilirubin, Direct: 1.1 mg/dL — ABNORMAL HIGH (ref 0.0–0.2)
Indirect Bilirubin: 1.2 mg/dL — ABNORMAL HIGH (ref 0.3–0.9)
Total Bilirubin: 2.3 mg/dL — ABNORMAL HIGH (ref 0.3–1.2)
Total Protein: 7.8 g/dL (ref 6.5–8.1)

## 2019-02-12 LAB — GLUCOSE, CAPILLARY
Glucose-Capillary: 200 mg/dL — ABNORMAL HIGH (ref 70–99)
Glucose-Capillary: 141 mg/dL — ABNORMAL HIGH (ref 70–99)
Glucose-Capillary: 176 mg/dL — ABNORMAL HIGH (ref 70–99)

## 2019-02-12 NOTE — Discharge Summary (Signed)
Physician Discharge Summary  Aaron [name] YIA:[id number] DOB: 11-Aug-1971 DOA: 02/07/2019  PCP: No primary care provider on file.  Admit date: 02/07/2019 Discharge date: 02/12/2019  Admitted From: Home Disposition: Inpatient psychiatric facility  Recommendations for Outpatient Follow-up:  1. Follow up with provider at inpatient psychiatric facility at earliest convenience 2. Outpatient follow-up with PCP with repeat CMP within a week   Home Health: No Equipment/Devices: None  Discharge Condition: Stable CODE STATUS: Full Diet recommendation: Heart Healthy  Brief/Interim Summary: 48 year old male with history of polysubstance abuse presented with suicidal ideation.  He was found to be febrile, tachycardic with elevated LFTs and there was question for cholecystitis.  General surgery was consulted.  HIDA scan was negative for cholecystitis.  General surgery has signed off.  Psychiatry was also consulted.  Psychiatry recommended inpatient psychiatric hospitalization.  LFTs are improving.  Discharge to inpatient psychiatric facility once bed is available.  Patient was supposed to be discharged on 02/10/2019 but is waiting for a psychiatric bed.   Discharge Diagnoses:  Principal Problem:   MDD (major depressive disorder), single episode, severe , no psychosis (HCC) Active Problems:   SIRS (systemic inflammatory response syndrome) (HCC)   Elevated LFTs   Suicidal ideation  Elevated LFTs -Questionable cause.  Might be drug-induced versus some  viral hepatitis. -Initially there was a concern for cholecystitis but HIDA scan is negative.  Antibiotics discontinued.  General surgery has signed off. -LFTs are improving.  No signs of encephalopathy.  Continue LFT follow-up daily while inpatient.   Outpatient follow-up of LFTs. -Hepatitis panel is positive for hepatitis C antibody which was positive in the past as well.  Patient probably has chronic untreated hepatitis C.  He will need outpatient  follow-up with PCP/GI -Currently has some epigastric pain and is  getting Protonix orally.  Acute renal failure -Treated with IV fluids.  Resolved  Hypokalemia -Improved.  Polysubstance abuse -Continue counseling  Severe depression with suicidal ideation -Psychiatry consultation appreciated.  Continue Software engineer.  Patient will need inpatient psychiatric hospitalization once bed is available.  Discharge Instructions  Discharge Instructions    Diet - low sodium heart healthy   Complete by:  As directed    Increase activity slowly   Complete by:  As directed      Allergies as of 02/12/2019      Reactions   Penicillins Swelling   Has patient had a PCN reaction causing immediate rash, facial/tongue/throat swelling, SOB or lightheadedness with hypotension: No Has patient had a PCN reaction causing severe rash involving mucus membranes or skin necrosis: Yes Has patient had a PCN reaction that required hospitalization: No Has patient had a PCN reaction occurring within the last 10 years: No If all of the above answers are "NO", then may proceed with Cephalosporin use.   Flexeril [cyclobenzaprine] Other (See Comments)   Jerking motions   Flexeril [cyclobenzaprine] Other (See Comments)   Cannot control muscle movements, causes jerking   Hysingla Er [hydrocodone Bitartrate Er] Other (See Comments)   Freaks me out   Vimovo [naproxen-esomeprazole]    GI Upset      Medication List    STOP taking these medications   acetaminophen 325 MG tablet Commonly known as:  TYLENOL   benzonatate 100 MG capsule Commonly known as:  TESSALON   bisacodyl 10 MG suppository Commonly known as:  DULCOLAX   clonazePAM 0.5 MG tablet Commonly known as:  KLONOPIN   diazepam 5 MG tablet Commonly known as:  VALIUM   DSS  100 MG Caps   methocarbamol 500 MG tablet Commonly known as:  ROBAXIN   Oxycodone HCl 10 MG Tabs   oxyCODONE-acetaminophen 5-325 MG tablet Commonly known as:   PERCOCET/ROXICET   polyethylene glycol packet Commonly known as:  MIRALAX / GLYCOLAX   traMADol 50 MG tablet Commonly known as:  ULTRAM     TAKE these medications   ibuprofen 600 MG tablet Commonly known as:  ADVIL,MOTRIN Take 1 tablet (600 mg total) by mouth every 6 (six) hours as needed for moderate pain. What changed:    medication strength  how much to take   ondansetron 4 MG tablet Commonly known as:  ZOFRAN Take 1 tablet (4 mg total) by mouth every 6 (six) hours as needed for nausea.      Follow-up Information    Quentin MullingCollier, Amanda, PA-C. Schedule an appointment as soon as possible for a visit in 1 week(s).   Specialty:  Physician Assistant Why:  With repeat CMP Contact information: [address] [place] [address] [phone]          Allergies  Allergen Reactions  . Penicillins Swelling    Has patient had a PCN reaction causing immediate rash, facial/tongue/throat swelling, SOB or lightheadedness with hypotension: No Has patient had a PCN reaction causing severe rash involving mucus membranes or skin necrosis: Yes Has patient had a PCN reaction that required hospitalization: No Has patient had a PCN reaction occurring within the last 10 years: No If all of the above answers are "NO", then may proceed with Cephalosporin use.   [name]. Flexeril [Cyclobenzaprine] Other (See Comments)    Jerking motions  . Flexeril [Cyclobenzaprine] Other (See Comments)    Cannot control muscle movements, causes jerking  . Hysingla Er [Hydrocodone Bitartrate Er] Other (See Comments)    Freaks me out  . Vimovo [Naproxen-Esomeprazole]     GI Upset    Consultations:  Psychiatry/general surgery   Procedures/Studies: Nm Hepato W/eject Fract  Result Date: 02/08/2019 CLINICAL DATA:  Abdominal pain with nausea and vomiting EXAM: NUCLEAR MEDICINE HEPATOBILIARY IMAGING WITH GALLBLADDER EF VIEWS: Anterior right upper quadrant RADIOPHARMACEUTICALS:  5.15 mCi  Tc-2145m  Choletec IV COMPARISON:  None. FINDINGS: Liver uptake of radiotracer is unremarkable. There is prompt visualization of gallbladder and small bowel, indicating patency of the cystic and common bile ducts. The patient consumed 8 ounces of Ensure with calculation of the computer generated ejection fraction of radiotracer from the gallbladder. The patient did not experience clinical symptoms with the oral Ensure consumption. The computer generated ejection fraction of radiotracer from the gallbladder is normal at 76%, normal greater than 33% using the oral agent. IMPRESSION: Study within normal limits. Electronically Signed   By: Bretta BangWilliam  Woodruff III M.D.   On: 02/08/2019 14:28   [place] Abdomen Limited  Result Date: 02/08/2019 CLINICAL DATA:  Transaminitis EXAM: ULTRASOUND ABDOMEN LIMITED RIGHT UPPER QUADRANT COMPARISON:  CT 04/11/2008 FINDINGS: Gallbladder: Contracted. No visible stones. Gallbladder wall is thickening, gallbladder wall is thickened, 7 mm. The patient was tender over the gallbladder during the study. Common bile duct: Diameter: Normal caliber, 4 mm Liver: Increased echotexture compatible with fatty infiltration. No focal abnormality or biliary ductal dilatation. Portal vein is patent on color Doppler imaging with normal direction of blood flow towards the liver. IMPRESSION: Fatty infiltration of the liver. Contracted gallbladder with mild gallbladder wall thickening and tenderness over the gallbladder during the study. No visible stones. Electronically Signed   By: Charlett NoseKevin  Dover M.D.   On: 02/08/2019  01:44   Subjective: Patient seen and examined at bedside.  He is a poor historian.  States that he does not feel good.  He has a flat affect and does not engage in conversation.  No overnight fever or vomiting. Discharge Exam: Vitals:   02/11/19 2101 02/12/19 0537  BP: (!) 148/91 (!) 141/91  Pulse: 90 83  Resp: 18 18  Temp: 98.5 F (36.9 C) 99 F (37.2 C)  SpO2: 94% 95%   Vitals:    02/10/19 1933 02/11/19 1439 02/11/19 2101 02/12/19 0537  BP: (!) 174/83 (!) 140/91 (!) 148/91 (!) 141/91  Pulse: 74 100 90 83  Resp: 20  18 18   Temp: 99.3 F (37.4 C) 99 F (37.2 C) 98.5 F (36.9 C) 99 F (37.2 C)  TempSrc: Oral Oral Oral Oral  SpO2: 100% 98% 94% 95%  Weight:      Height:        General: Pt is alert, awake, in no distress distress.  Poor historian.  Flat affect Cardiovascular: rate controlled, S1/S2 + Respiratory: bilateral decreased breath sounds at bases, no wheezing Abdominal: Soft, mildly tender in the epigastric region, ND, bowel sounds + Extremities: no edema, no cyanosis    The results of significant diagnostics from this hospitalization (including imaging, microbiology, ancillary and laboratory) are listed below for reference.     Microbiology: Recent Results (from the past 240 hour(s))  Culture, blood (Routine X 2) w Reflex to ID Panel     Status: None (Preliminary result)   Collection Time: 02/08/19  1:20 AM  Result Value Ref Range Status   Specimen Description BLOOD LEFT ARM  Final   Special Requests   Final    BOTTLES DRAWN AEROBIC AND ANAEROBIC Blood Culture results may not be optimal due to an excessive volume of blood received in culture bottles   Culture   Final    NO GROWTH 3 DAYS Performed at [facility], [address], [place], [place] [address]    Report Status PENDING  Incomplete  Culture, blood (Routine X 2) w Reflex to ID Panel     Status: None (Preliminary result)   Collection Time: 02/08/19  1:22 AM  Result Value Ref Range Status   Specimen Description BLOOD RIGHT ARM  Final   Special Requests   Final    BOTTLES DRAWN AEROBIC AND ANAEROBIC Blood Culture results may not be optimal due to an excessive volume of blood received in culture bottles   Culture   Final    NO GROWTH 3 DAYS Performed at [facility], [address] [place], [place] [address]    Report Status PENDING  Incomplete     Labs: BNP (last 3  results) No results for input(s): BNP in the last 8760 hours. Basic Metabolic Panel: Recent Labs  Lab 02/07/19 2133 02/08/19 0440 02/09/19 0431 02/10/19 0446 02/11/19 0545  NA 127* 135 138 137 134*  K 3.1* 3.1* 3.3* 3.6 3.8  CL 86* 98 106 104 105  CO2 24 28 25 23 22   GLUCOSE 177* 172* 176* 122* 183*  BUN 7 6 <5* <5* 7  CREATININE 1.49* 1.18 0.96 1.08 0.97  CALCIUM 8.4* 7.6* 7.8* 8.4* 8.7*  MG  --  1.7  --  1.6*  --    Liver Function Tests: Recent Labs  Lab 02/08/19 0440 02/09/19 0431 02/10/19 0446 02/11/19 0545 02/12/19 0520  AST 994* 1,538* 962* 385* 156*  ALT 809* 1,525* 1,438* 1,075* 687*  ALKPHOS 95  109 127* 165* 150*  BILITOT 1.7* 2.7* 3.6* 3.2* 2.3*  PROT 6.1* 5.7* 6.4* 7.1 7.8  ALBUMIN 2.9* 2.5* 2.8* 3.2* 3.2*   Recent Labs  Lab 02/08/19 0054 02/08/19 0440  LIPASE 30 30   No results for input(s): AMMONIA in the last 168 hours. CBC: Recent Labs  Lab 02/07/19 2133 02/08/19 0440 02/10/19 0446  WBC 11.4* 6.9 6.1  NEUTROABS  --  5.2 2.7  HGB 11.4* 9.6* 10.6*  HCT 35.9* 29.9* 34.0*  MCV 78.6* 81.0 78.7*  PLT 286 196 227   Cardiac Enzymes: No results for input(s): CKTOTAL, CKMB, CKMBINDEX, TROPONINI in the last 168 hours. BNP: Invalid input(s): POCBNP CBG: Recent Labs  Lab 02/10/19 1707 02/11/19 0022 02/11/19 0802 02/12/19 0020 02/12/19 0752  GLUCAP 118* 154* 161* 200* 141*   D-Dimer No results for input(s): DDIMER in the last 72 hours. Hgb A1c No results for input(s): HGBA1C in the last 72 hours. Lipid Profile No results for input(s): CHOL, HDL, LDLCALC, TRIG, CHOLHDL, LDLDIRECT in the last 72 hours. Thyroid function studies No results for input(s): TSH, T4TOTAL, T3FREE, THYROIDAB in the last 72 hours.  Invalid input(s): FREET3 Anemia work up No results for input(s): VITAMINB12, FOLATE, FERRITIN, TIBC, IRON, RETICCTPCT in the last 72 hours. Urinalysis    Component Value Date/Time   COLORURINE YELLOW 04/11/2008 0700   APPEARANCEUR  CLEAR 04/11/2008 0700   LABSPEC 1.025 01/08/2011 2031   PHURINE 5.5 01/08/2011 2031   GLUCOSEU NEGATIVE 04/11/2008 0700   HGBUR NEGATIVE 01/08/2011 2031   BILIRUBINUR NEGATIVE 01/08/2011 2031   KETONESUR NEGATIVE 01/08/2011 2031   PROTEINUR NEGATIVE 01/08/2011 2031   UROBILINOGEN 0.2 01/08/2011 2031   NITRITE NEGATIVE 01/08/2011 2031   LEUKOCYTESUR  01/08/2011 2031    NEGATIVE Biochemical Testing Only. Please order routine urinalysis from main lab if confirmatory testing is needed.   Sepsis Labs Invalid input(s): PROCALCITONIN,  WBC,  LACTICIDVEN Microbiology Recent Results (from the past 240 hour(s))  Culture, blood (Routine X 2) w Reflex to ID Panel     Status: None (Preliminary result)   Collection Time: 02/08/19  1:20 AM  Result Value Ref Range Status   Specimen Description BLOOD LEFT ARM  Final   Special Requests   Final    BOTTLES DRAWN AEROBIC AND ANAEROBIC Blood Culture results may not be optimal due to an excessive volume of blood received in culture bottles   Culture   Final    NO GROWTH 3 DAYS Performed at [facility], [address], [place], [place] [address]    Report Status PENDING  Incomplete  Culture, blood (Routine X 2) w Reflex to ID Panel     Status: None (Preliminary result)   Collection Time: 02/08/19  1:22 AM  Result Value Ref Range Status   Specimen Description BLOOD RIGHT ARM  Final   Special Requests   Final    BOTTLES DRAWN AEROBIC AND ANAEROBIC Blood Culture results may not be optimal due to an excessive volume of blood received in culture bottles   Culture   Final    NO GROWTH 3 DAYS Performed at [facility], [address] [place], [place] [address]    Report Status PENDING  Incomplete     Time coordinating discharge: 35 minutes  SIGNED:   Glade Lloyd, MD  [facility] 02/12/2019, 8:57 AM Pager: [phone]  If 7PM-7AM, please contact night-coverage [email or web address] Password [email or web address]

## 2019-02-12 NOTE — Plan of Care (Signed)
  Problem: Education: Goal: Knowledge of General Education information will improve Description Including pain rating scale, medication(s)/side effects and non-pharmacologic comfort measures Outcome: Completed/Met   Problem: Health Behavior/Discharge Planning: Goal: Ability to manage health-related needs will improve Outcome: Completed/Met   Problem: Clinical Measurements: Goal: Ability to maintain clinical measurements within normal limits will improve Outcome: Completed/Met Goal: Will remain free from infection Outcome: Completed/Met Goal: Diagnostic test results will improve Outcome: Completed/Met Goal: Respiratory complications will improve Outcome: Completed/Met Goal: Cardiovascular complication will be avoided Outcome: Completed/Met   Problem: Activity: Goal: Risk for activity intolerance will decrease Outcome: Completed/Met   Problem: Nutrition: Goal: Adequate nutrition will be maintained Outcome: Completed/Met   Problem: Coping: Goal: Level of anxiety will decrease Outcome: Completed/Met   Problem: Elimination: Goal: Will not experience complications related to bowel motility Outcome: Completed/Met Goal: Will not experience complications related to urinary retention Outcome: Completed/Met   Problem: Pain Managment: Goal: General experience of comfort will improve Outcome: Completed/Met   Problem: Safety: Goal: Ability to remain free from injury will improve Outcome: Completed/Met   Problem: Skin Integrity: Goal: Risk for impaired skin integrity will decrease Outcome: Completed/Met   Problem: Spiritual Needs Goal: Ability to function at adequate level Outcome: Completed/Met   

## 2019-02-12 NOTE — Progress Notes (Signed)
[facility] reviewing patient.   Osborne Casco Granite Godman LCSW [phone]

## 2019-02-12 NOTE — Discharge Instructions (Addendum)
Your A1c is 7.1% this admission. This is in the range meeting criteria for Diabetes. Please follow up with PCP.

## 2019-02-12 NOTE — Progress Notes (Signed)
CSW still awaiting bed availability at [facility]. CSW spoke with patient regarding process and he signed the voluntary [facility] admission form.   Osborne Casco Chantalle Defilippo LCSW [phone]

## 2019-02-13 ENCOUNTER — Other Ambulatory Visit: Payer: Self-pay | Admitting: Family

## 2019-02-13 ENCOUNTER — Inpatient Hospital Stay (HOSPITAL_COMMUNITY)
Admission: AD | Admit: 2019-02-13 | Discharge: 2019-02-16 | DRG: 885 | Disposition: A | Discharge status: Home / Self Care | Payer: Federal, State, Local not specified - Other | Source: Intra-hospital | Attending: Psychiatry | Admitting: Psychiatry

## 2019-02-13 DIAGNOSIS — T402X5A Adverse effect of other opioids, initial encounter: Secondary | ICD-10-CM | POA: Diagnosis present

## 2019-02-13 DIAGNOSIS — G4700 Insomnia, unspecified: Secondary | ICD-10-CM | POA: Diagnosis present

## 2019-02-13 DIAGNOSIS — R1011 Right upper quadrant pain: Secondary | ICD-10-CM | POA: Diagnosis present

## 2019-02-13 DIAGNOSIS — F329 Major depressive disorder, single episode, unspecified: Secondary | ICD-10-CM | POA: Diagnosis present

## 2019-02-13 DIAGNOSIS — M549 Dorsalgia, unspecified: Secondary | ICD-10-CM | POA: Diagnosis present

## 2019-02-13 DIAGNOSIS — K219 Gastro-esophageal reflux disease without esophagitis: Secondary | ICD-10-CM | POA: Diagnosis present

## 2019-02-13 DIAGNOSIS — Z79899 Other long term (current) drug therapy: Secondary | ICD-10-CM | POA: Diagnosis not present

## 2019-02-13 DIAGNOSIS — G8929 Other chronic pain: Secondary | ICD-10-CM | POA: Diagnosis present

## 2019-02-13 DIAGNOSIS — Z880 Allergy status to penicillin: Secondary | ICD-10-CM | POA: Diagnosis not present

## 2019-02-13 DIAGNOSIS — F333 Major depressive disorder, recurrent, severe with psychotic symptoms: Principal | ICD-10-CM | POA: Diagnosis present

## 2019-02-13 DIAGNOSIS — Z791 Long term (current) use of non-steroidal anti-inflammatories (NSAID): Secondary | ICD-10-CM | POA: Diagnosis not present

## 2019-02-13 DIAGNOSIS — F1124 Opioid dependence with opioid-induced mood disorder: Secondary | ICD-10-CM

## 2019-02-13 DIAGNOSIS — F419 Anxiety disorder, unspecified: Secondary | ICD-10-CM | POA: Diagnosis present

## 2019-02-13 DIAGNOSIS — F1120 Opioid dependence, uncomplicated: Secondary | ICD-10-CM | POA: Diagnosis present

## 2019-02-13 LAB — CULTURE, BLOOD (ROUTINE X 2)
Culture: NO GROWTH
Culture: NO GROWTH

## 2019-02-13 LAB — GLUCOSE, CAPILLARY
GLUCOSE-CAPILLARY: 158 mg/dL — AB (ref 70–99)
Glucose-Capillary: 140 mg/dL — ABNORMAL HIGH (ref 70–99)
Glucose-Capillary: 143 mg/dL — ABNORMAL HIGH (ref 70–99)

## 2019-02-13 LAB — COMPREHENSIVE METABOLIC PANEL
ALT: 447 U/L — ABNORMAL HIGH (ref 0–44)
AST: 82 U/L — ABNORMAL HIGH (ref 15–41)
Albumin: 3.3 g/dL — ABNORMAL LOW (ref 3.5–5.0)
Alkaline Phosphatase: 145 U/L — ABNORMAL HIGH (ref 38–126)
Anion gap: 12 (ref 5–15)
BUN: 8 mg/dL (ref 6–20)
CHLORIDE: 99 mmol/L (ref 98–111)
CO2: 23 mmol/L (ref 22–32)
Calcium: 9.1 mg/dL (ref 8.9–10.3)
Creatinine, Ser: 1.1 mg/dL (ref 0.61–1.24)
GFR calc Af Amer: >60 mL/min (ref >60)
GFR calc non Af Amer: >60 mL/min (ref >60)
Glucose, Bld: 155 mg/dL — ABNORMAL HIGH (ref 70–99)
POTASSIUM: 3.9 mmol/L (ref 3.5–5.1)
SODIUM: 134 mmol/L — AB (ref 135–145)
Total Bilirubin: 2 mg/dL — ABNORMAL HIGH (ref 0.3–1.2)
Total Protein: 7.7 g/dL (ref 6.5–8.1)

## 2019-02-13 NOTE — Progress Notes (Signed)
Patient ID: Aaron [name], male   DOB: 1971/08/29, 48 y.o.   MRN: [id number] Patient is waiting to be discharged to inpatient psychiatric facility.  He is medically stable for discharge.  Patient seen at bedside.  LFTs improving.  Please refer to the discharge summary done by me on 02/12/2019 for full details.

## 2019-02-13 NOTE — Progress Notes (Signed)
Pt accepted to [facility]; room 307-1 Dr. Sharma Covert is the accepting provider.   Dr. Jola Babinski is the attending provider. Call report to [phone]   RN @ [facility] notified.    Pt is voluntary and can be transported by [facility].   Pt is scheduled to arrive at [facility] at 9pm  Wells Guiles, LCSW, LCAS Disposition CSW [facility] [phone]

## 2019-02-13 NOTE — Progress Notes (Signed)
[facility] reviewing referral and will contact CSW back.   Osborne Casco Alishea Beaudin LCSW [phone]

## 2019-02-13 NOTE — Progress Notes (Signed)
Aaron [name] was discharged via wheelchair with Ambulance person to [facility].  Report given.  Discharge paperwork given.  All personal belongings accounted for.  Pt denies any further questions.

## 2019-02-14 ENCOUNTER — Other Ambulatory Visit: Payer: Self-pay

## 2019-02-14 ENCOUNTER — Encounter (HOSPITAL_COMMUNITY): Payer: Self-pay | Admitting: *Deleted

## 2019-02-14 DIAGNOSIS — F1124 Opioid dependence with opioid-induced mood disorder: Secondary | ICD-10-CM

## 2019-02-14 DIAGNOSIS — F333 Major depressive disorder, recurrent, severe with psychotic symptoms: Principal | ICD-10-CM

## 2019-02-14 DIAGNOSIS — F329 Major depressive disorder, single episode, unspecified: Secondary | ICD-10-CM | POA: Diagnosis present

## 2019-02-14 LAB — TSH: TSH: 1.019 u[IU]/mL (ref 0.350–4.500)

## 2019-02-14 MED ORDER — SERTRALINE HCL 25 MG PO TABS
25.0000 mg | ORAL_TABLET | Freq: Every day | ORAL | Status: DC
  Administered 2019-02-14 – 2019-02-15 (×2): 25 mg via ORAL
  Filled 2019-02-14 (×5): qty 1

## 2019-02-14 MED ORDER — ALUM & MAG HYDROXIDE-SIMETH 200-200-20 MG/5ML PO SUSP: 30.0000 mL | ORAL | Status: DC | PRN

## 2019-02-14 MED ORDER — TRAZODONE HCL 100 MG PO TABS
100.0000 mg | ORAL_TABLET | Freq: Every evening | ORAL | Status: DC | PRN
  Administered 2019-02-14: 100 mg via ORAL
  Filled 2019-02-14 (×6): qty 1

## 2019-02-14 MED ORDER — MAGNESIUM HYDROXIDE 400 MG/5ML PO SUSP: 30.0000 mL | Freq: Every day | ORAL | Status: DC | PRN

## 2019-02-14 MED ORDER — HYDROXYZINE HCL 50 MG PO TABS
50.0000 mg | ORAL_TABLET | Freq: Four times a day (QID) | ORAL | Status: DC | PRN
  Administered 2019-02-14 (×3): 50 mg via ORAL
  Filled 2019-02-14 (×3): qty 1

## 2019-02-14 MED ORDER — ACETAMINOPHEN 325 MG PO TABS
650.0000 mg | ORAL_TABLET | Freq: Four times a day (QID) | ORAL | Status: DC | PRN
  Administered 2019-02-14: 650 mg via ORAL
  Filled 2019-02-14: qty 2

## 2019-02-14 MED ORDER — PANTOPRAZOLE SODIUM 40 MG PO TBEC
40.0000 mg | DELAYED_RELEASE_TABLET | Freq: Every day | ORAL | Status: DC
  Administered 2019-02-14 – 2019-02-16 (×3): 40 mg via ORAL
  Filled 2019-02-14 (×5): qty 1

## 2019-02-14 MED ORDER — ONDANSETRON 4 MG PO TBDP
4.0000 mg | ORAL_TABLET | Freq: Three times a day (TID) | ORAL | Status: DC | PRN
  Administered 2019-02-14: 4 mg via ORAL
  Filled 2019-02-14: qty 1

## 2019-02-14 MED ORDER — IBUPROFEN 600 MG PO TABS
600.0000 mg | ORAL_TABLET | Freq: Four times a day (QID) | ORAL | Status: DC | PRN
  Administered 2019-02-14 – 2019-02-15 (×2): 600 mg via ORAL
  Filled 2019-02-14 (×2): qty 1

## 2019-02-14 MED ORDER — TRAZODONE HCL 50 MG PO TABS
50.0000 mg | ORAL_TABLET | Freq: Every evening | ORAL | Status: DC | PRN
  Administered 2019-02-14 (×2): 50 mg via ORAL
  Filled 2019-02-14: qty 1

## 2019-02-14 NOTE — Progress Notes (Signed)
Nursing Progress Note: 7p-7a D: Pt currently presents with a depressed/anxious/preoccupied affect and behavior. Pt states "I am going through withdrawals and nobody cares. I want clonidine. I want to go home." Not Interacting with the milieu. Pt reports poor sleep during the previous night with current medication regimen. Pt did not attend wrap-up group.  A: Pt provided with medications per providers orders. Pt's labs and vitals were monitored throughout the night. Pt supported emotionally and encouraged to express concerns and questions. Pt educated on medications.  R: Pt's safety ensured with 15 minute and environmental checks. Pt currently denies SI, HI, and AVH. Pt verbally contracts to seek staff if SI,HI, or AVH occurs and to consult with staff before acting on any harmful thoughts. Will continue to monitor.

## 2019-02-14 NOTE — Progress Notes (Signed)
D: Pt alert and oriented. Pt rates depression 10/10, hopelessness was not report by pt, and anxiety rated 10/10.Pt goal: pt did not give a goal. Pt reports energy leave as low and concentration as being poor and appetite as being poor. Pt reports sleep last night as being poor. Pt did receive medications for sleep and did not find them helpful. Pt reports experiencing pain in abdominal area and Ibuprofen 600 mg q 6 hr has been ordered for pain. Pt received Ibuprofen while admitted to the hospital and did not experience any adverse effects. Pt denies experiencing any SI/HI, or AVH at this time.   Pt reports experiencing withdrawal s/s lightheadedness, dizziness, tremors, and nausea on self inventory.   A: Scheduled medications administered to pt, per MD orders. Support and encouragement provided. Frequent verbal contact made. Routine safety checks conducted q15 minutes.   R: No adverse drug reactions noted. Pt verbally contracts for safety at this time. Pt complaint with medications and treatment plan. Pt interacts well with others on the unit. Pt remains safe at this time. Will continue to monitor.

## 2019-02-14 NOTE — Progress Notes (Signed)
Recreation Therapy Notes  Date:  2.19. 20 Time: 0930 Location: [address] Hall Dayroom  Group Topic: Stress Management  Goal Area(s) Addresses:  Patient will identify positive stress management techniques. Patient will identify benefits of using stress management post d/c.  Intervention: Stress Management  Activity :  Meditation.  LRT introduced the stress management technique of meditation.  LRT played a meditation that focused on pure possibilities.  Patients were to listen and follow along as meditation played to engage in activity.    Education:  Stress Management, Discharge Planning.   Education Outcome: Acknowledges Education  Clinical Observations/Feedback: Pt did not attend group.     Caroll Rancher, LRT/CTRS     Lillia Abed, Damia Bobrowski A 02/14/2019 11:06 AM

## 2019-02-14 NOTE — Tx Team (Signed)
Interdisciplinary Treatment and Diagnostic Plan Update  02/14/2019 Time of Session: 0830AM Aaron [name] MRN: [id number]  Principal Diagnosis: MDD, recurrent, severe  Secondary Diagnoses: Active Problems:   MDD (major depressive disorder)   Current Medications:  Current Facility-Administered Medications  Medication Dose Route Frequency Provider Last Rate Last Dose  . acetaminophen (TYLENOL) tablet 650 mg  650 mg Oral Q6H PRN Derrill Center, NP   650 mg at 02/14/19 0104  . alum & mag hydroxide-simeth (MAALOX/MYLANTA) 200-200-20 MG/5ML suspension 30 mL  30 mL Oral Q4H PRN Derrill Center, NP      . hydrOXYzine (ATARAX/VISTARIL) tablet 50 mg  50 mg Oral Q6H PRN Patriciaann Clan E, PA-C   50 mg at 02/14/19 0217  . magnesium hydroxide (MILK OF MAGNESIA) suspension 30 mL  30 mL Oral Daily PRN Derrill Center, NP      . ondansetron (ZOFRAN-ODT) disintegrating tablet 4 mg  4 mg Oral Q8H PRN Patriciaann Clan E, PA-C   4 mg at 02/14/19 0104  . pantoprazole (PROTONIX) EC tablet 40 mg  40 mg Oral Daily Simon, Spencer E, PA-C      . traZODone (DESYREL) tablet 50 mg  50 mg Oral QHS PRN Derrill Center, NP   50 mg at 02/14/19 0105   PTA Medications: Medications Prior to Admission  Medication Sig Dispense Refill Last Dose  . ibuprofen (ADVIL,MOTRIN) 600 MG tablet Take 1 tablet (600 mg total) by mouth every 6 (six) hours as needed for moderate pain. 30 tablet 0   . ondansetron (ZOFRAN) 4 MG tablet Take 1 tablet (4 mg total) by mouth every 6 (six) hours as needed for nausea. 20 tablet 0     Patient Stressors: Loss of parent Substance abuse  Patient Strengths: Ability for insight Average or above average intelligence Capable of independent living General fund of knowledge  Treatment Modalities: Medication Management, Group therapy, Case management,  1 to 1 session with clinician, Psychoeducation, Recreational therapy.   Physician Treatment Plan for Primary Diagnosis: MDD, recurrent,  severe Medication Management: Evaluate patient's response, side effects, and tolerance of medication regimen.  Therapeutic Interventions: 1 to 1 sessions, Unit Group sessions and Medication administration.  Evaluation of Outcomes: Not Met  Physician Treatment Plan for Secondary Diagnosis: Active Problems:   MDD (major depressive disorder)   Medication Management: Evaluate patient's response, side effects, and tolerance of medication regimen.  Therapeutic Interventions: 1 to 1 sessions, Unit Group sessions and Medication administration.  Evaluation of Outcomes: Not Met   RN Treatment Plan for Primary Diagnosis: MDD, recurrent, severe Long Term Goal(s): Knowledge of disease and therapeutic regimen to maintain health will improve  Short Term Goals: Ability to remain free from injury will improve, Ability to verbalize frustration and anger appropriately will improve, Ability to demonstrate self-control and Ability to disclose and discuss suicidal ideas  Medication Management: RN will administer medications as ordered by provider, will assess and evaluate patient's response and provide education to patient for prescribed medication. RN will report any adverse and/or side effects to prescribing provider.  Therapeutic Interventions: 1 on 1 counseling sessions, Psychoeducation, Medication administration, Evaluate responses to treatment, Monitor vital signs and CBGs as ordered, Perform/monitor CIWA, COWS, AIMS and Fall Risk screenings as ordered, Perform wound care treatments as ordered.  Evaluation of Outcomes: Not Met   LCSW Treatment Plan for Primary Diagnosis: MDD, recurrent, severe Long Term Goal(s): Safe transition to appropriate next level of care at discharge, Engage patient in therapeutic group addressing interpersonal concerns.  Short Term Goals: Engage patient in aftercare planning with referrals and resources, Increase emotional regulation, Facilitate patient progression through  stages of change regarding substance use diagnoses and concerns and Identify triggers associated with mental health/substance abuse issues  Therapeutic Interventions: Assess for all discharge needs, 1 to 1 time with Social worker, Explore available resources and support systems, Assess for adequacy in community support network, Educate family and significant other(s) on suicide prevention, Complete Psychosocial Assessment, Interpersonal group therapy.  Evaluation of Outcomes: Not Met   Progress in Treatment: Attending groups: No. Pt new to unit. Continuing to assess.  Participating in groups: No. Taking medication as prescribed: Yes. Toleration medication: Yes. Family/Significant other contact made: No, will contact:  family member if pt consents to collateral contact.  Patient understands diagnosis: Yes. Discussing patient identified problems/goals with staff: Yes. Medical problems stabilized or resolved: Yes. Denies suicidal/homicidal ideation: Yes. Issues/concerns per patient self-inventory: No. Other: n/a   New problem(s) identified: No, Describe:  n/a  New Short Term/Long Term Goal(s): detox, medication management for mood stabilization; elimination of SI thoughts; development of comprehensive mental wellness/sobriety plan.   Patient Goals: "Need help with using drugs and for being suicidal."  Discharge Plan or Barriers: CSW assessing for appropriate referrals. [facility] [place] pamphlet, Mobile Crisis information, and AA/NA information provided to patient for additional community support and resources.   Reason for Continuation of Hospitalization: Anxiety Depression Medication stabilization Suicidal ideation Withdrawal symptoms  Estimated Length of Stay: Monday, 02/19/2019  Attendees: Patient: Aaron [name] 02/14/2019 9:34 AM  Physician: Dr. Parke Poisson MD 02/14/2019 9:34 AM  Nursing: Arlyss Repress RN; Alyssa RN 02/14/2019 9:34 AM  RN Care Manager:x 02/14/2019 9:34 AM  Social Worker: Janice Norrie  LCSW 02/14/2019 9:34 AM  Recreational Therapist: x 02/14/2019 9:34 AM  Other: Lindell Spar NP; Harriett Sine NP 02/14/2019 9:34 AM  Other:  02/14/2019 9:34 AM  Other: 02/14/2019 9:34 AM    Scribe for Treatment Team: Avelina Laine, LCSW 02/14/2019 9:34 AM

## 2019-02-14 NOTE — Progress Notes (Signed)
Pt was given 50 Trazodone, 650 Acetaminophen, 4 Zofran. Pt came up to the nursing station stating what he took was causing him to shake. Pt given 50 mg Vistaril per MAR. When pt came back to the medication window, pt was not visibly shaking as before. Pt very medication focused.

## 2019-02-14 NOTE — Progress Notes (Signed)
Pt did not attend wrap-up group   

## 2019-02-14 NOTE — Tx Team (Signed)
Initial Treatment Plan 02/14/2019 12:20 AM Aaron [name] QIH:[id number]    PATIENT STRESSORS: Loss of parent Substance abuse   PATIENT STRENGTHS: Ability for insight Average or above average intelligence Capable of independent living General fund of knowledge   PATIENT IDENTIFIED PROBLEMS: Depression Suicidal thoughts Heroin Abuse "Make me better"                     DISCHARGE CRITERIA:  Ability to meet basic life and health needs Improved stabilization in mood, thinking, and/or behavior Reduction of life-threatening or endangering symptoms to within safe limits Verbal commitment to aftercare and medication compliance  PRELIMINARY DISCHARGE PLAN: Attend aftercare/continuing care group Return to previous living arrangement  PATIENT/FAMILY INVOLVEMENT: This treatment plan has been presented to and reviewed with the patient, Aaron [name], and/or family member, .  The patient and family have been given the opportunity to ask questions and make suggestions.  Searcy Miyoshi, [place], [place] 02/14/2019, 12:20 AM
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
## 2019-02-14 NOTE — [facility] Suicide Risk Assessment (Addendum)
[facility] Admission Suicide Risk Assessment   Nursing information obtained from:  Patient Demographic factors:  Male, Caucasian Current Mental Status:  NA Loss Factors:  Loss of significant relationship Historical Factors:  NA Risk Reduction Factors:  Living with another person, especially a relative, Positive therapeutic relationship, Positive coping skills or problem solving skills  Total Time spent with patient: 45 minutes Principal Problem:  MDD versus Substance Induced Mood Disorder, Opiate Dependence Diagnosis:  Active Problems:   MDD (major depressive disorder)  Subjective Data:   Continued Clinical Symptoms:  Alcohol Use Disorder Identification Test Final Score (AUDIT): 4 The "Alcohol Use Disorders Identification Test", Guidelines for Use in Primary Care, Second Edition.  World Science writer [facility]). Score between 0-7:  no or low risk or alcohol related problems. Score between 8-15:  moderate risk of alcohol related problems. Score between 16-19:  high risk of alcohol related problems. Score 20 or above:  warrants further diagnostic evaluation for alcohol dependence and treatment.   CLINICAL FACTORS:  54, married, has one adult daughter, patient lives with wife, currently unemployed. Reports legal issues, upcoming court date. Patient presented to ED voluntarily on 2/13 reporting worsening depression , suicidal thoughts , auditory hallucinations which had started about a week prior . He was also experiencing significant abdominal ( RUQ) pain and required initial admission to medical unit for suspicion of cholecystitis  ( HIDA scan was negative) . While in inpatient medical unit was seen by Psychiatry consultant, Dr. Sharma Covert, who recommended psychiatric admission upon medical clearance. Patient reports he has been depressed since November of 2019, after his mother passed away. He reports opiate ( heroin) dependence, which started about 8 months ago. Was using daily prior to  admission. Denies IVDA. Denies alcohol or BZD abuse. No prior psychiatric admissions, no history of suicide attempts, no history of self cutting , reports history of prior depressive episodes but characterized as mild . Denies history of psychosis, no history of mania, no history of PTSD, no history of violence,  states he had not been treated with psychiatric medications in the past . Denies medical illnesses, allergic to PCN. Does not smoke cigarettes. No history of seizures . Was not taking any medications prior to admission. No family psychiatric or substance abuse reported . Labs reviewed- LFTs remain elevated but have trended down . 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 Start Zoloft 25 mgrs QDAY for depression. Will not start Opiate detox treatment as last heroin use was 2/12 and patient has been in inpatient setting since then, not currently presenting with restlessness or significant  WDL symptoms Check Hepatic Function Test, BMP, CBC in AM.        Musculoskeletal: Strength & Muscle Tone: within normal limits Gait & Station: normal Patient leans: N/A  Psychiatric Specialty Exam: Physical Exam  ROS reports persistent RUQ discomfort, nausea,  no fever  Blood pressure 133/83, pulse (!) 107, temperature 98.7 F (37.1 C), temperature source Oral, resp. rate 20, height 5\' 11"  (1.803 m), weight 99.8 kg.Body mass index is 30.68 kg/m.  General Appearance: Fairly Groomed  Eye Contact:  Fair  Speech:  Normal Rate  Volume:  Decreased  Mood:  Depressed  Affect:  constricted   Thought Process:  Linear and Descriptions of Associations: Intact  Orientation:  Other:  alert and attentive, oriented x 3   Thought Content:  no hallucinations, no delusions, states  hallucinations he had experienced prior to admission have resolved   Suicidal Thoughts:  No denies  suicidal or self injurious ideations at this time and contracts for safety on unit, denies homicidal or violent ideations  Homicidal Thoughts:  No  Memory:  recent and remote grossly intact   Judgement:  Fair  Insight:  Fair  Psychomotor Activity:  Decreased  Concentration:  Concentration: Fair and Attention Span: Fair  Recall:  [employer] of Knowledge:  Fair  Language:  Fair  Akathisia:  Negative  Handed:  Right  AIMS (if indicated):     Assets:  Communication Skills Desire for Improvement  ADL's:  Intact  Cognition:  WNL  Sleep:  Number of Hours: 1.5      COGNITIVE FEATURES THAT CONTRIBUTE TO RISK:  Closed-mindedness and Loss of executive function    SUICIDE RISK:   Moderate:  Frequent suicidal ideation with limited intensity, and duration, some specificity in terms of plans, no associated intent, good self-control, limited dysphoria/symptomatology, some risk factors present, and identifiable protective factors, including available and accessible social support.  PLAN OF CARE: Patient will be admitted to inpatient psychiatric unit for stabilization and safety. Will provide and encourage milieu participation. Provide medication management and maked adjustments as needed.  Will follow daily.    I certify that inpatient services furnished can reasonably be expected to improve the patient's condition.   Craige Cotta, MD 02/14/2019, 11:35 AM

## 2019-02-14 NOTE — Progress Notes (Signed)
Aaron [name] is a 48 year old male pt admitted on voluntary basis after transfer from medical floor. On admission, he reports that he messed up and reports that he attempted to overdose on heroin in attempt to kill himself. He denies SI on admission but still endorsing feeling depressed. He complains of nausea and abdominal pain on admission and reports he has been having on-going pain for a few days now. He reports that he lives with wife and child and reports that he will return back there after discharge. Aaron [name] was able to complete admission process without incident, he was escorted to the unit, oriented to the milieu and safety maintained.

## 2019-02-15 LAB — BASIC METABOLIC PANEL
Anion gap: 10 (ref 5–15)
BUN: 9 mg/dL (ref 6–20)
CO2: 24 mmol/L (ref 22–32)
Calcium: 9.1 mg/dL (ref 8.9–10.3)
Chloride: 100 mmol/L (ref 98–111)
Creatinine, Ser: 1.13 mg/dL (ref 0.61–1.24)
GFR calc non Af Amer: >60 mL/min (ref >60)
Glucose, Bld: 151 mg/dL — ABNORMAL HIGH (ref 70–99)
Potassium: 3.6 mmol/L (ref 3.5–5.1)
Sodium: 134 mmol/L — ABNORMAL LOW (ref 135–145)

## 2019-02-15 LAB — CBC WITH DIFFERENTIAL/PLATELET
ABS IMMATURE GRANULOCYTES: 0.02 10*3/uL (ref 0.00–0.07)
Basophils Absolute: 0 10*3/uL (ref 0.0–0.1)
Basophils Relative: 0 %
Eosinophils Absolute: 0.2 10*3/uL (ref 0.0–0.5)
Eosinophils Relative: 3 %
HCT: 42.7 % (ref 39.0–52.0)
Hemoglobin: 13 g/dL (ref 13.0–17.0)
Immature Granulocytes: 0 %
Lymphocytes Relative: 39 %
Lymphs Abs: 2.9 10*3/uL (ref 0.7–4.0)
MCH: 25.1 pg — ABNORMAL LOW (ref 26.0–34.0)
MCHC: 30.4 g/dL (ref 30.0–36.0)
MCV: 82.6 fL (ref 80.0–100.0)
Monocytes Absolute: 0.6 10*3/uL (ref 0.1–1.0)
Monocytes Relative: 8 %
NEUTROS ABS: 3.7 10*3/uL (ref 1.7–7.7)
NEUTROS PCT: 50 %
NRBC: 0 % (ref 0.0–0.2)
Platelets: 472 10*3/uL — ABNORMAL HIGH (ref 150–400)
RBC: 5.17 MIL/uL (ref 4.22–5.81)
RDW: 13.7 % (ref 11.5–15.5)
WBC: 7.3 10*3/uL (ref 4.0–10.5)

## 2019-02-15 LAB — HEPATIC FUNCTION PANEL
ALBUMIN: 4.1 g/dL (ref 3.5–5.0)
ALT: 230 U/L — ABNORMAL HIGH (ref 0–44)
AST: 64 U/L — ABNORMAL HIGH (ref 15–41)
Alkaline Phosphatase: 123 U/L (ref 38–126)
Bilirubin, Direct: 0.6 mg/dL — ABNORMAL HIGH (ref 0.0–0.2)
Indirect Bilirubin: 1 mg/dL — ABNORMAL HIGH (ref 0.3–0.9)
Total Bilirubin: 1.6 mg/dL — ABNORMAL HIGH (ref 0.3–1.2)
Total Protein: 8.1 g/dL (ref 6.5–8.1)

## 2019-02-15 MED ORDER — HYDROXYZINE HCL 25 MG PO TABS
25.0000 mg | ORAL_TABLET | Freq: Four times a day (QID) | ORAL | Status: DC | PRN
  Administered 2019-02-15: 25 mg via ORAL
  Filled 2019-02-15: qty 1

## 2019-02-15 MED ORDER — SERTRALINE HCL 50 MG PO TABS
50.0000 mg | ORAL_TABLET | Freq: Every day | ORAL | Status: DC
  Administered 2019-02-16: 50 mg via ORAL
  Filled 2019-02-15 (×3): qty 1

## 2019-02-15 MED ORDER — TRAZODONE HCL 100 MG PO TABS
100.0000 mg | ORAL_TABLET | Freq: Every evening | ORAL | Status: DC | PRN
  Administered 2019-02-15: 100 mg via ORAL
  Filled 2019-02-15: qty 1

## 2019-02-15 NOTE — Progress Notes (Signed)
Pt was reported to be unresponsive to name. Upon assessment, pt did not respond to name, but was able to raise arm when asked for a BP. Pt then began mildly jolting intermittently and stopped jolting when staff left the room. Pt ignored Clinical research associate but was alert and oriented x4 to tech. Vitals were obtained and WNL. Pt in no sign of distress. Pt refused to get up for breakfast and labs. Will continue to monitor.
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
## 2019-02-15 NOTE — Progress Notes (Signed)
D: Pt was in bed in his room upon initial approach.  Pt presents with anxious, depressed affect and mood.  Describes his day as "cruddy" and states he "just don't feel good."  Pt denies SI/HI, denies hallucinations, reports R sided abdominal pain of 9/10.  Pt has been isolative to his room for the majority of the night with few peer interactions.   A: Introduced self to pt.  Met with pt 1:1.  Actively listened to pt and offered support and encouragement.  PRN medication administered for anxiety, sleep, and pain.  Q15 minute safety checks maintained.  R: Pt is safe on the unit.  Pt is compliant with medications.  Pt verbally contracts for safety.  Will continue to monitor and assess.

## 2019-02-15 NOTE — [facility] Counselor (Signed)
Adult Comprehensive Assessment  Patient ID: Aaron [name], male   DOB: February 12, 1971, 48 y.o.   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 Family Relationships: Mother died in 11-23-19Financial / Lack of resources (include bankruptcy): Slow work season, limited income right now Housing / Lack of housing: Denies Physical health (include injuries & life threatening diseases): Denies issues Social relationships: Says he has good supports Substance abuse: Started using heroin daily after mother died in Nov 19, 2023 Bereavement / Loss: Mother died in 11-19-23  Living/Environment/Situation:  Living Arrangements: Spouse/significant other Living conditions (as described by patient or guardian): Single family home in [place] Who else lives in the home?: Wife How long has patient lived in current situation?: 20+ years What is atmosphere in current home: Comfortable  Family History:  Marital status: Married Number of Years Married: 30 What types of issues is patient dealing with in the relationship?: Denies Additional relationship information: Denies Are you sexually active?: Yes What is your sexual orientation?: Straight Has your sexual activity been affected by drugs, alcohol, medication, or emotional stress?: Denies Does patient have children?: Yes How many children?: 1 How is patient's relationship with their children?: 49 year old daughter, "good"   Childhood History:  By whom was/is the patient raised?: Mother, Grandparents Additional childhood history information: Raised primarily by maternal grandparents. Mom was around but was always busy with work.  Dad not involved. Description of patient's relationship with caregiver when they were a child: Good relationship with mom and grandparents Patient's description of current relationship with people who raised him/her: All deceased How were you disciplined when you got in trouble as a child/adolescent?: Appropriate Does patient have siblings?: Yes Number of Siblings: 2 Description of patient's current relationship with siblings: Two older brothers, "good" Did patient suffer any verbal/emotional/physical/sexual abuse as a child?: No Did patient suffer from severe childhood neglect?: No Has patient ever been sexually abused/assaulted/raped as an adolescent or adult?: No Was the patient ever a victim of a crime or a disaster?: No Witnessed domestic violence?: No Has patient been effected by domestic violence as an adult?: No  Education:  Highest grade of school patient has completed: 10th grade Currently a student?: No Learning disability?: No  Employment/Work Situation:   Employment situation: Employed 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 How long has patient been employed?: 30 years Patient's job has been impacted by current illness: No What is the longest time patient has a held a job?: 30 years Where was the patient employed at that time?: current Did You Receive Any Psychiatric Treatment/Services While in Equities trader?: No Are There Guns or Other Weapons in Your Home?: No  Financial Resources:   Financial resources: Income from employment Does patient have a representative payee or guardian?: No  Alcohol/Substance Abuse:   What has been your use of drugs/alcohol within the last 12 months?: Daily heroin use since November 23, 2019Alcohol/Substance Abuse Treatment Hx: Denies past history Has alcohol/substance abuse ever caused legal problems?: No  Social Support System:   Conservation officer, nature Support System: Good Describe Community Support System: Wife,  friends Type of faith/religion: Denies How does patient's faith help to cope with current illness?: N/A  Leisure/Recreation:   Leisure and Hobbies: Watching  sports  Strengths/Needs:   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 Patient states they can use these personal strengths during their treatment to contribute to their recovery: unsure Patient states these barriers may affect/interfere with their treatment: Denies Patient states these barriers may affect their return to the community: Denies Other important information patient would like considered in planning for their treatment: Wants to discharge ASAP, states he will go to [employer] with a buddy  Discharge Plan:   Currently receiving community mental health services: No Patient states concerns and preferences for aftercare planning are: Agreeable to [facility] outpatient referral, says he will go to AA meetings with a friend Patient states they will know when they are safe and ready for discharge when: Ready now Does patient have access to transportation?: Yes Does patient have financial barriers related to discharge medications?: Yes Patient description of barriers related to discharge medications: No insurance, referred to [facility] Will patient be returning to same living situation after discharge?: Yes  Summary/Recommendations:   Summary and Recommendations (to be completed by the evaluator): Aaron [name] is a 48 year old male from [facility]). He presents voluntarily to [facility] seeking treatment for SI. Patient reports he started using heroin daily since the death of his mother in 14-Nov-2018. Other stressors include a slow work season and limited income. At discharge, he plans to attend AA meetings and declines referrals for residential treatment. Patient will benefit from crisis stabilzation, medication management, therapeutic milieu, and referrals for service.   Aaron [name]. 02/15/2019

## 2019-02-15 NOTE — Plan of Care (Signed)
Problem: Safety: Goal: Periods of time without injury will increase Outcome: Progressing   Problem: Medication: Goal: Compliance with prescribed medication regimen will improve Outcome: Progressing DAR Note: Pt noted in his room at intervals during shift. Presents with blunted affect and irritable on interactions. Pt did not attend groups on unit despite multiple prompts. Took his medications without issues, continues to demand for benzo or "y'all need to put me on something".  Emotional support offered to pt throughout this shift. Encouraged pt to voice concerns, attend to ADLs and comply with current treatment regimen including groups. Scheduled medications given per order with verbal education and effects monitored. Safety checks maintained at Q 15 minutes intervals without self harm gestures or outburst. Tolerated all PO intake well. Compliant with medications when offered. Denies side effects. Remains safe on and off unit.

## 2019-02-15 NOTE — [facility] Group Notes (Signed)
Date/Time 02/15/2019 1:15 PM  Type of Therapy/Topic:  Group Therapy:  Feelings about Diagnosis  Participation Level:  Did not attend  Description of Group:    This group will allow patients to explore their thoughts and feelings about diagnoses they have received. Patients will be guided to explore their level of understanding and acceptance of these diagnoses. Facilitator will encourage patients to process their thoughts and feelings about the reactions of others to their diagnosis, and will guide patients in identifying ways to discuss their diagnosis with significant others in their lives. This group will be process-oriented, with patients participating in exploration of their own experiences as well as giving and receiving support and challenge from other group members.   Therapeutic Goals: 1. Patient will demonstrate understanding of diagnosis as evidence by identifying two or more symptoms of the disorder:  2. Patient will be able to express two feelings regarding the diagnosis 3. Patient will demonstrate ability to communicate their needs through discussion and/or role plays  Marian Sorrow, MSW Intern 02/15/2019 4:09

## 2019-02-16 MED ORDER — HYDROXYZINE HCL 25 MG PO TABS: 25.0000 mg | ORAL_TABLET | Freq: Four times a day (QID) | ORAL | 0 refills | Status: AC | PRN

## 2019-02-16 MED ORDER — SERTRALINE HCL 50 MG PO TABS: 50.0000 mg | ORAL_TABLET | Freq: Every day | ORAL | 0 refills | Status: AC

## 2019-02-16 MED ORDER — PANTOPRAZOLE SODIUM 40 MG PO TBEC: 40.0000 mg | DELAYED_RELEASE_TABLET | Freq: Every day | ORAL | 0 refills | Status: AC

## 2019-02-16 MED ORDER — TRAZODONE HCL 100 MG PO TABS: 100.0000 mg | ORAL_TABLET | Freq: Every evening | ORAL | 0 refills | Status: AC | PRN
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
## 2019-02-16 NOTE — Plan of Care (Signed)
  Problem: Education: Goal: Emotional status will improve Outcome: Not Progressing Goal: Mental status will improve Outcome: Not Progressing Goal: Verbalization of understanding the information provided will improve Outcome: Not Progressing   Problem: Activity: Goal: Interest or engagement in activities will improve Outcome: Not Progressing   

## 2019-02-16 NOTE — [facility] Suicide Risk Assessment (Signed)
[facility] Discharge Suicide Risk Assessment   Principal Problem: Opioid dependence with opioid-induced mood disorder [facility]) Discharge Diagnoses: Principal Problem:   Opioid dependence with opioid-induced mood disorder (HCC) Active Problems:   MDD (major depressive disorder)   Total Time spent with patient: 30 minutes  Musculoskeletal: Strength & Muscle Tone: within normal limits Gait & Station: normal Patient leans: N/A  Psychiatric Specialty Exam: ROS denies headache, no chest pain, no shortness of breath, reports significantly improved right upper quadrant/abdominal pain or discomfort.  Denies vomiting.  No rash.  Blood pressure (!) 101/54, pulse (!) 123, temperature 99.1 F (37.3 C), temperature source Oral, resp. rate 20, height 5\' 11"  (1.803 m), weight 99.8 kg, SpO2 98 %.Body mass index is 30.68 kg/m.  General Appearance: Improving grooming  Eye Contact::  Good  Speech:  Normal Rate409  Volume:  Normal  Mood:  States he is feeling "a lot better" does present with improved mood, less dysphoric  Affect:  Becoming more reactive, smiles briefly at times  Thought Process:  Linear and Descriptions of Associations: Intact  Orientation:  Full (Time, Place, and Person)  Thought Content:  Denies hallucinations, no delusions expressed  Suicidal Thoughts:  No denies any suicidal or self-injurious ideations, also denies any homicidal or violent ideations  Homicidal Thoughts:  No  Memory:  Recent and remote grossly intact  Judgement:  Fair/improving  Insight:  Fair  Psychomotor Activity:  Normal-no psychomotor agitation or restlessness noted at this time  Concentration:  Good  Recall:  Good  Fund of Knowledge:Good  Language: Good  Akathisia:  Negative  Handed:  Right  AIMS (if indicated):     Assets:  Desire for Improvement Resilience Social Support  Sleep:  Number of Hours: 6.75  Cognition: WNL  ADL's:  Intact   Mental Status Per Nursing Assessment::   On Admission:   NA  Demographic Factors:  48 year old married male, one adult daughter  Loss Factors: Mother passed away 12-17-2019substance abuse issues  Historical Factors: History of depression, particularly after his mother passed away, history of opiate dependence  Risk Reduction Factors:   Sense of responsibility to family, Living with another person, especially a relative and Positive coping skills or problem solving skills  Continued Clinical Symptoms:  Today patient presents alert, attentive, better groomed, calm and in no acute distress or discomfort, acknowledges feeling better, mood presents improved with a more reactive affect, smiles at times appropriately during session, no suicidal ideations, future oriented, no homicidal ideations, no hallucinations, no delusions.  Currently presents future oriented stating he is hoping to return to work soon.  At this time expresses a high level of motivation and sobriety, states "I am done with doing drugs, I have a family , home and I do want to lose them " At this time tolerating Zoloft trial well, denies side effects thus far.  Does not endorse significant withdrawal symptoms and does not appear to be in any acute distress or discomfort.   Patient reports improving/abating right upper quadrant/abdominal pain and currently does not appear to be in any acute distress or discomfort , denies vomiting, denies choluria or acholia, tolerating p.o. intake well.  We have reviewed labs-AST and ALT continue to trend down, white blood count 7.3, differential unremarkable.  Recent hep C antibody was positive.  We reviewed this with patient, and I stressed the importance of outpatient follow-up for further monitoring and eventual treatment.    Cognitive Features That Contribute To Risk:  No gross cognitive deficits noted  upon discharge. Is alert , attentive, and oriented x 3   Suicide Risk:  Mild:  Suicidal ideation of limited frequency, intensity, duration,  and specificity.  There are no identifiable plans, no associated intent, mild dysphoria and related symptoms, good self-control (both objective and subjective assessment), few other risk factors, and identifiable protective factors, including available and accessible social support.  Follow-up Information    [facility] Follow up on 02/23/2019.   Why:  Hospital follow up appointment is Friday, 2/28 at 8:00a.  Please bring your photo ID, proof of insurance, SSN, current medications and discharge paperwork from this hospitalization,  Contact information: [address] [place] [address] [phone]        [facility]              Follow up.   Why:  Please call [phone] to schedule follow-up appointment for Hepatitis C. Contact information: [address] [place] [address]          Plan Of Care/Follow-up recommendations:  Activity:  As tolerated Diet:  Regular Tests:  NA Other:  See below  Patient is expressing readiness for discharge, there are no current grounds for involuntary commitment. He is leaving unit in good spirits.  He plans to return home.  Plans to follow-up as above.  I have recommended that he follow-up with outpatient infectious disease clinic for management and support. He expresses high level of motivation and sobriety at this time, states he plans to discontinue relationships with people who are not sober/use drugs, and plans to attend 12-step meetings.   Craige Cotta, MD 02/16/2019, 11:08 AM

## 2019-02-16 NOTE — Progress Notes (Signed)
Adult Psychoeducational Group Note  Date:  02/16/2019 Time:  9:40 AM  Group Topic/Focus:  Goals Group:   The focus of this group is to help patients establish daily goals to achieve during treatment and discuss how the patient can incorporate goal setting into their daily lives to aide in recovery.  Participation Level:  Did Not Attend  Participation Quality:  Did not attend  Affect:  Did not attend  Cognitive:  Did not attend  Insight: None  Engagement in Group:  Did not attend  Modes of Intervention:  Did not attend  Additional Comments:  Patient did not attend goals group this morning. Patient was notified 15 minutes prior.  Deetra Booton L Tlaloc Taddei 02/16/2019, 9:40 AM

## 2019-02-16 NOTE — Progress Notes (Signed)
  [facility] Adult Case Management Discharge Plan :  Will you be returning to the same living situation after discharge:  Yes,  home At discharge, do you have transportation home?: Yes,  wife picking up Do you have the ability to pay for your medications: Yes,  income from employment  Release of information consent forms completed and in the chart.  Patient to Follow up at: Follow-up Information    [facility] Follow up on 02/23/2019.   Why:  Hospital follow up appointment is Friday, 2/28 at 8:00a.  Please bring your photo ID, proof of insurance, SSN, current medications and discharge paperwork from this hospitalization,  Contact information: [address] [place] [address] [phone]           Next level of care provider has access to [facility] Link:no  Safety Planning and Suicide Prevention discussed: Yes,  with wife  Have you used any form of tobacco in the last 30 days? (Cigarettes, Smokeless Tobacco, Cigars, and/or Pipes): No  Has patient been referred to the Quitline?: Patient refused referral  Patient has been referred for addiction treatment: Yes  Darreld Mclean, LCSWA 02/16/2019, 9:02 AM

## 2019-02-16 NOTE — Progress Notes (Signed)
Discharge note: Patient reviewed discharge paperwork with RN including prescriptions, follow up appointments, and lab work. Patient given the opportunity to ask questions. All concerns were addressed. All belongings were returned to patient. Denied SI/HI/AVH. Patient thanked staff for their care while at the hospital.  Patient was discharged to lobby where his wife was waiting to pick him up.

## 2019-04-18 ENCOUNTER — Emergency Department (HOSPITAL_COMMUNITY)
Admission: EM | Admit: 2019-04-18 | Discharge: 2019-04-18 | Disposition: A | Discharge status: Home / Self Care | Payer: Self-pay | Attending: Emergency Medicine | Admitting: Emergency Medicine

## 2019-04-18 ENCOUNTER — Other Ambulatory Visit: Payer: Self-pay

## 2019-04-18 ENCOUNTER — Encounter (HOSPITAL_COMMUNITY): Payer: Self-pay | Admitting: Emergency Medicine

## 2019-04-18 DIAGNOSIS — I10 Essential (primary) hypertension: Secondary | ICD-10-CM | POA: Insufficient documentation

## 2019-04-18 DIAGNOSIS — T401X1A Poisoning by heroin, accidental (unintentional), initial encounter: Secondary | ICD-10-CM | POA: Insufficient documentation

## 2019-04-18 LAB — BASIC METABOLIC PANEL
Anion gap: 11 (ref 5–15)
BUN: 7 mg/dL (ref 6–20)
CO2: 26 mmol/L (ref 22–32)
Calcium: 9.3 mg/dL (ref 8.9–10.3)
Chloride: 101 mmol/L (ref 98–111)
Creatinine, Ser: 1.26 mg/dL — ABNORMAL HIGH (ref 0.61–1.24)
GFR calc Af Amer: >60 mL/min (ref >60)
GFR calc non Af Amer: >60 mL/min (ref >60)
Glucose, Bld: 260 mg/dL — ABNORMAL HIGH (ref 70–99)
Potassium: 3.5 mmol/L (ref 3.5–5.1)
Sodium: 138 mmol/L (ref 135–145)

## 2019-04-18 LAB — CBC
HCT: 39.3 % (ref 39.0–52.0)
Hemoglobin: 11.5 g/dL — ABNORMAL LOW (ref 13.0–17.0)
MCH: 22.2 pg — ABNORMAL LOW (ref 26.0–34.0)
MCHC: 29.3 g/dL — ABNORMAL LOW (ref 30.0–36.0)
MCV: 75.7 fL — ABNORMAL LOW (ref 80.0–100.0)
Platelets: 524 10*3/uL — ABNORMAL HIGH (ref 150–400)
RBC: 5.19 MIL/uL (ref 4.22–5.81)
RDW: 14.3 % (ref 11.5–15.5)
WBC: 12.5 10*3/uL — ABNORMAL HIGH (ref 4.0–10.5)
nRBC: 0 % (ref 0.0–0.2)

## 2019-04-18 LAB — ETHANOL: Alcohol, Ethyl (B): <10 mg/dL (ref <10)

## 2019-04-18 MED ORDER — TAMSULOSIN HCL 0.4 MG PO CAPS: 0.4000 mg | ORAL_CAPSULE | Freq: Every day | ORAL | 0 refills | Status: AC

## 2019-04-18 MED ORDER — SODIUM CHLORIDE 0.9 % IV BOLUS
1000.0000 mL | Freq: Once | INTRAVENOUS | Status: AC
  Administered 2019-04-18: 15:00:00 1000 mL via INTRAVENOUS

## 2019-04-18 MED ORDER — CIPROFLOXACIN HCL 500 MG PO TABS: 500.0000 mg | ORAL_TABLET | Freq: Two times a day (BID) | ORAL | 0 refills | Status: AC
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
## 2019-04-18 NOTE — ED Notes (Signed)
Pt unwilling to stay to provide urine sample. MD campos aware.

## 2019-04-18 NOTE — ED Notes (Signed)
Patient Alert and oriented to baseline. Stable and ambulatory to baseline. Patient verbalized understanding of the discharge instructions.  Patient belongings were taken by the patient.   

## 2019-04-18 NOTE — Discharge Instructions (Addendum)
Follow up with your primary care physician or a urologist regarding your urination
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
## 2019-05-05 ENCOUNTER — Emergency Department (HOSPITAL_COMMUNITY): Payer: No Typology Code available for payment source

## 2019-05-05 ENCOUNTER — Other Ambulatory Visit: Payer: Self-pay

## 2019-05-05 ENCOUNTER — Encounter (HOSPITAL_COMMUNITY): Payer: Self-pay | Admitting: Emergency Medicine

## 2019-05-05 ENCOUNTER — Emergency Department (HOSPITAL_COMMUNITY)
Admission: EM | Admit: 2019-05-05 | Discharge: 2019-05-06 | Disposition: A | Discharge status: Home / Self Care | Payer: No Typology Code available for payment source | Attending: Emergency Medicine | Admitting: Emergency Medicine

## 2019-05-05 DIAGNOSIS — Y929 Unspecified place or not applicable: Secondary | ICD-10-CM | POA: Insufficient documentation

## 2019-05-05 DIAGNOSIS — S59912A Unspecified injury of left forearm, initial encounter: Secondary | ICD-10-CM | POA: Diagnosis present

## 2019-05-05 DIAGNOSIS — M62830 Muscle spasm of back: Secondary | ICD-10-CM | POA: Diagnosis not present

## 2019-05-05 DIAGNOSIS — S52225A Nondisplaced transverse fracture of shaft of left ulna, initial encounter for closed fracture: Secondary | ICD-10-CM | POA: Insufficient documentation

## 2019-05-05 DIAGNOSIS — Y939 Activity, unspecified: Secondary | ICD-10-CM | POA: Insufficient documentation

## 2019-05-05 DIAGNOSIS — Y999 Unspecified external cause status: Secondary | ICD-10-CM | POA: Diagnosis not present

## 2019-05-05 DIAGNOSIS — M542 Cervicalgia: Secondary | ICD-10-CM | POA: Insufficient documentation

## 2019-05-05 DIAGNOSIS — I10 Essential (primary) hypertension: Secondary | ICD-10-CM | POA: Insufficient documentation

## 2019-05-05 MED ORDER — SODIUM CHLORIDE 0.9 % IV BOLUS
1000.0000 mL | Freq: Once | INTRAVENOUS | Status: AC
  Administered 2019-05-05: 1000 mL via INTRAVENOUS

## 2019-05-05 MED ORDER — FENTANYL CITRATE (PF) 100 MCG/2ML IJ SOLN
75.0000 ug | Freq: Once | INTRAMUSCULAR | Status: AC
  Administered 2019-05-05: 75 ug via INTRAVENOUS
  Filled 2019-05-05: qty 2
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
## 2019-05-05 NOTE — ED Notes (Signed)
Delay in lab draw,   Pt enroute to xray. 

## 2019-05-05 NOTE — ED Notes (Signed)
Patient transported to X-ray 

## 2019-05-06 ENCOUNTER — Emergency Department (HOSPITAL_COMMUNITY): Payer: No Typology Code available for payment source

## 2019-05-06 LAB — COMPREHENSIVE METABOLIC PANEL
ALT: 26 U/L (ref 0–44)
AST: 17 U/L (ref 15–41)
Albumin: 3.3 g/dL — ABNORMAL LOW (ref 3.5–5.0)
Alkaline Phosphatase: 87 U/L (ref 38–126)
Anion gap: 7 (ref 5–15)
BUN: 13 mg/dL (ref 6–20)
CO2: 24 mmol/L (ref 22–32)
Calcium: 8.4 mg/dL — ABNORMAL LOW (ref 8.9–10.3)
Chloride: 100 mmol/L (ref 98–111)
Creatinine, Ser: 1.14 mg/dL (ref 0.61–1.24)
GFR calc Af Amer: >60 mL/min (ref >60)
GFR calc non Af Amer: >60 mL/min (ref >60)
Glucose, Bld: 270 mg/dL — ABNORMAL HIGH (ref 70–99)
Potassium: 3.9 mmol/L (ref 3.5–5.1)
Sodium: 131 mmol/L — ABNORMAL LOW (ref 135–145)
Total Bilirubin: 0.4 mg/dL (ref 0.3–1.2)
Total Protein: 6.7 g/dL (ref 6.5–8.1)

## 2019-05-06 LAB — CBC
HCT: 31 % — ABNORMAL LOW (ref 39.0–52.0)
Hemoglobin: 9.5 g/dL — ABNORMAL LOW (ref 13.0–17.0)
MCH: 21.6 pg — ABNORMAL LOW (ref 26.0–34.0)
MCHC: 30.6 g/dL (ref 30.0–36.0)
MCV: 70.5 fL — ABNORMAL LOW (ref 80.0–100.0)
Platelets: 260 10*3/uL (ref 150–400)
RBC: 4.4 MIL/uL (ref 4.22–5.81)
RDW: 14.4 % (ref 11.5–15.5)
WBC: 6.8 10*3/uL (ref 4.0–10.5)
nRBC: 0 % (ref 0.0–0.2)

## 2019-05-06 MED ORDER — MORPHINE SULFATE 15 MG PO TABS: 15.0000 mg | ORAL_TABLET | Freq: Four times a day (QID) | ORAL | 0 refills | Status: AC | PRN

## 2019-05-06 MED ORDER — OXYCODONE-ACETAMINOPHEN 5-325 MG PO TABS
1.0000 | ORAL_TABLET | Freq: Once | ORAL | Status: AC
  Administered 2019-05-06: 1 via ORAL
  Filled 2019-05-06: qty 1

## 2019-05-06 MED ORDER — IOHEXOL 300 MG/ML  SOLN
125.0000 mL | Freq: Once | INTRAMUSCULAR | Status: AC | PRN
  Administered 2019-05-06: 125 mL via INTRAVENOUS

## 2019-05-06 MED ORDER — ACETAMINOPHEN 500 MG PO TABS: 1000.0000 mg | ORAL_TABLET | Freq: Four times a day (QID) | ORAL | 0 refills | Status: AC

## 2019-05-06 NOTE — ED Notes (Signed)
Patient transported to CT 

## 2019-05-06 NOTE — Progress Notes (Signed)
Orthopedic Tech Progress Note Patient Details:  Aaron [name] 10-May-1971 [id number]  Ortho Devices Type of Ortho Device: Sugartong splint, Sling immobilizer Ortho Device/Splint Interventions: Adjustment, Application, Ordered   Post Interventions Patient Tolerated: Well Instructions Provided: Care of device, Adjustment of device   Norva Karvonen T 05/06/2019, 2:34 AM

## 2019-05-06 NOTE — ED Notes (Signed)
Ortho at bedside.

## 2019-06-23 ENCOUNTER — Other Ambulatory Visit: Payer: Self-pay

## 2019-06-23 ENCOUNTER — Emergency Department (HOSPITAL_COMMUNITY): Payer: Self-pay

## 2019-06-23 ENCOUNTER — Encounter (HOSPITAL_COMMUNITY): Payer: Self-pay

## 2019-06-23 ENCOUNTER — Emergency Department (HOSPITAL_COMMUNITY)
Admission: EM | Admit: 2019-06-23 | Discharge: 2019-06-24 | Disposition: A | Discharge status: Home / Self Care | Payer: Self-pay | Attending: Emergency Medicine | Admitting: Emergency Medicine

## 2019-06-23 DIAGNOSIS — F332 Major depressive disorder, recurrent severe without psychotic features: Secondary | ICD-10-CM | POA: Insufficient documentation

## 2019-06-23 DIAGNOSIS — R079 Chest pain, unspecified: Secondary | ICD-10-CM | POA: Insufficient documentation

## 2019-06-23 DIAGNOSIS — F1120 Opioid dependence, uncomplicated: Secondary | ICD-10-CM | POA: Insufficient documentation

## 2019-06-23 DIAGNOSIS — Z20828 Contact with and (suspected) exposure to other viral communicable diseases: Secondary | ICD-10-CM | POA: Insufficient documentation

## 2019-06-23 DIAGNOSIS — R1012 Left upper quadrant pain: Secondary | ICD-10-CM

## 2019-06-23 DIAGNOSIS — F1110 Opioid abuse, uncomplicated: Secondary | ICD-10-CM

## 2019-06-23 DIAGNOSIS — K6389 Other specified diseases of intestine: Secondary | ICD-10-CM

## 2019-06-23 DIAGNOSIS — R1010 Upper abdominal pain, unspecified: Secondary | ICD-10-CM | POA: Insufficient documentation

## 2019-06-23 DIAGNOSIS — F1124 Opioid dependence with opioid-induced mood disorder: Secondary | ICD-10-CM | POA: Diagnosis present

## 2019-06-23 DIAGNOSIS — I10 Essential (primary) hypertension: Secondary | ICD-10-CM | POA: Insufficient documentation

## 2019-06-23 DIAGNOSIS — R0602 Shortness of breath: Secondary | ICD-10-CM | POA: Insufficient documentation

## 2019-06-23 DIAGNOSIS — Z79899 Other long term (current) drug therapy: Secondary | ICD-10-CM | POA: Insufficient documentation

## 2019-06-23 LAB — CBC WITH DIFFERENTIAL/PLATELET
Abs Immature Granulocytes: 0.02 10*3/uL (ref 0.00–0.07)
Basophils Absolute: 0 10*3/uL (ref 0.0–0.1)
Basophils Relative: 1 %
Eosinophils Absolute: 0 10*3/uL (ref 0.0–0.5)
Eosinophils Relative: 0 %
HCT: 35.9 % — ABNORMAL LOW (ref 39.0–52.0)
Hemoglobin: 10.3 g/dL — ABNORMAL LOW (ref 13.0–17.0)
Immature Granulocytes: 0 %
Lymphocytes Relative: 25 %
Lymphs Abs: 1.5 10*3/uL (ref 0.7–4.0)
MCH: 20.4 pg — ABNORMAL LOW (ref 26.0–34.0)
MCHC: 28.7 g/dL — ABNORMAL LOW (ref 30.0–36.0)
MCV: 71.2 fL — ABNORMAL LOW (ref 80.0–100.0)
Monocytes Absolute: 0.6 10*3/uL (ref 0.1–1.0)
Monocytes Relative: 10 %
Neutro Abs: 3.7 10*3/uL (ref 1.7–7.7)
Neutrophils Relative %: 64 %
Platelets: 258 10*3/uL (ref 150–400)
RBC: 5.04 MIL/uL (ref 4.22–5.81)
RDW: 15.8 % — ABNORMAL HIGH (ref 11.5–15.5)
WBC: 5.8 10*3/uL (ref 4.0–10.5)
nRBC: 0 % (ref 0.0–0.2)

## 2019-06-23 LAB — SARS CORONAVIRUS 2 BY RT PCR (HOSPITAL ORDER, PERFORMED IN ~~LOC~~ HOSPITAL LAB): SARS Coronavirus 2: NEGATIVE

## 2019-06-23 LAB — TROPONIN I (HIGH SENSITIVITY)
Troponin I (High Sensitivity): 2 ng/L (ref <18)
Troponin I (High Sensitivity): 3 ng/L (ref <18)

## 2019-06-23 LAB — COMPREHENSIVE METABOLIC PANEL
ALT: 28 U/L (ref 0–44)
AST: 19 U/L (ref 15–41)
Albumin: 3.8 g/dL (ref 3.5–5.0)
Alkaline Phosphatase: 104 U/L (ref 38–126)
Anion gap: 13 (ref 5–15)
BUN: 8 mg/dL (ref 6–20)
CO2: 19 mmol/L — ABNORMAL LOW (ref 22–32)
Calcium: 9.4 mg/dL (ref 8.9–10.3)
Chloride: 101 mmol/L (ref 98–111)
Creatinine, Ser: 1.02 mg/dL (ref 0.61–1.24)
GFR calc Af Amer: >60 mL/min (ref >60)
GFR calc non Af Amer: >60 mL/min (ref >60)
Glucose, Bld: 371 mg/dL — ABNORMAL HIGH (ref 70–99)
Potassium: 4 mmol/L (ref 3.5–5.1)
Sodium: 133 mmol/L — ABNORMAL LOW (ref 135–145)
Total Bilirubin: 0.3 mg/dL (ref 0.3–1.2)
Total Protein: 7.5 g/dL (ref 6.5–8.1)

## 2019-06-23 LAB — LIPASE, BLOOD: Lipase: 46 U/L (ref 11–51)

## 2019-06-23 MED ORDER — IOHEXOL 300 MG/ML  SOLN
100.0000 mL | Freq: Once | INTRAMUSCULAR | Status: AC | PRN
  Administered 2019-06-23: 100 mL via INTRAVENOUS

## 2019-06-23 MED ORDER — METHOCARBAMOL 1000 MG/10ML IJ SOLN
1000.0000 mg | Freq: Once | INTRAVENOUS | Status: AC
  Administered 2019-06-23: 1000 mg via INTRAVENOUS
  Filled 2019-06-23: qty 10

## 2019-06-23 MED ORDER — METHOCARBAMOL 1000 MG/10ML IJ SOLN: 1000.0000 mg | Freq: Once | INTRAMUSCULAR | Status: DC

## 2019-06-23 MED ORDER — ONDANSETRON HCL 4 MG/2ML IJ SOLN
4.0000 mg | Freq: Once | INTRAMUSCULAR | Status: AC
  Administered 2019-06-23: 21:00:00 4 mg via INTRAVENOUS
  Filled 2019-06-23: qty 2

## 2019-06-23 MED ORDER — MORPHINE SULFATE (PF) 4 MG/ML IV SOLN
4.0000 mg | Freq: Once | INTRAVENOUS | Status: AC
  Administered 2019-06-23: 4 mg via INTRAVENOUS
  Filled 2019-06-23: qty 1

## 2019-06-23 MED ORDER — ONDANSETRON HCL 4 MG PO TABS
4.0000 mg | ORAL_TABLET | Freq: Three times a day (TID) | ORAL | Status: DC | PRN
  Administered 2019-06-24: 4 mg via ORAL
  Filled 2019-06-23 (×2): qty 1

## 2019-06-23 MED ORDER — LACTATED RINGERS IV BOLUS
1000.0000 mL | Freq: Once | INTRAVENOUS | Status: AC
  Administered 2019-06-23: 1000 mL via INTRAVENOUS

## 2019-06-23 MED ORDER — SODIUM CHLORIDE 0.9 % IV BOLUS
1000.0000 mL | Freq: Once | INTRAVENOUS | Status: AC
  Administered 2019-06-23: 1000 mL via INTRAVENOUS

## 2019-06-23 MED ORDER — DIPHENHYDRAMINE HCL 25 MG PO CAPS
50.0000 mg | ORAL_CAPSULE | Freq: Once | ORAL | Status: AC
  Administered 2019-06-23: 50 mg via ORAL
  Filled 2019-06-23: qty 2

## 2019-06-23 MED ORDER — ACETAMINOPHEN 325 MG PO TABS
650.0000 mg | ORAL_TABLET | ORAL | Status: DC | PRN
  Administered 2019-06-24: 650 mg via ORAL
  Filled 2019-06-23: qty 2

## 2019-06-23 MED ORDER — IOHEXOL 350 MG/ML SOLN
100.0000 mL | Freq: Once | INTRAVENOUS | Status: AC | PRN
  Administered 2019-06-23: 100 mL via INTRAVENOUS

## 2019-06-23 MED ORDER — LOPERAMIDE HCL 2 MG PO CAPS: 2.0000 mg | ORAL_CAPSULE | Freq: Four times a day (QID) | ORAL | 0 refills | Status: AC | PRN

## 2019-06-23 MED ORDER — LOPERAMIDE HCL 2 MG PO CAPS
2.0000 mg | ORAL_CAPSULE | Freq: Once | ORAL | Status: AC
  Administered 2019-06-23: 21:00:00 2 mg via ORAL
  Filled 2019-06-23: qty 1

## 2019-06-23 MED ORDER — ONDANSETRON 4 MG PO TBDP: 4.0000 mg | ORAL_TABLET | Freq: Three times a day (TID) | ORAL | 0 refills | Status: AC | PRN

## 2019-06-23 MED ORDER — METHOCARBAMOL 500 MG PO TABS: 1000.0000 mg | ORAL_TABLET | Freq: Two times a day (BID) | ORAL | 0 refills | Status: AC

## 2019-06-23 MED ORDER — HYDROMORPHONE HCL 1 MG/ML IJ SOLN
1.0000 mg | Freq: Once | INTRAMUSCULAR | Status: AC
  Administered 2019-06-23: 1 mg via INTRAVENOUS
  Filled 2019-06-23: qty 1

## 2019-06-23 NOTE — ED Notes (Signed)
Patient transported to CT 

## 2019-06-23 NOTE — ED Notes (Signed)
Pt requesting information about rehab, states he "does not want to live like this anymore" referring to substance abuse. Pt denies solid plan for suicide and when asked if he is thinking about harming himself he says, "I don't want to."

## 2019-06-23 NOTE — ED Triage Notes (Signed)
Pt here with CP and shortness of breath beginning at 0200 when he woke up. SOB associated with CP, some  Nausea at beginning no dizziness. Pt states last time he had pain like this they said it was his gallbladder.  Pt uses roxycodone and heroin, last use of heroin yesterday and couple of days last took pills

## 2019-06-23 NOTE — ED Provider Notes (Signed)
  Physical Exam  BP 122/85 (BP Location: Right Arm)   Pulse 83   Temp 98.3 F (36.8 C) (Oral)   Resp 12   SpO2 97%   Physical Exam  ED Course/Procedures     Procedures  MDM  Received care of patient from Dr. Verner Chol please.  Please see his note for prior history, physical and care.  Briefly this is a 48 year old male who presented with chest pain, shortness of breath as well as abdominal pain.  Initial troponin, chest x-ray without acute abnormalities.  CT abdomen pelvis pending.  CT abdomen pelvis does not show etiology of patient's abdominal pain, but does show increasing size of abnormality at the cecum, concerning for possible colonic malignancy.  Patient does have a family history of colon cancer.  He does not yet have a gastroenterologist.  Given his shortness of breath without clear cause, as well as concern for possible underlying malignancy, will order CT PE study for further evaluation.  CTPE without abnormality.  Delta troponins negative.    Recommend outpatient follow up with PCP and gastroenterology.    Patient now reports he is worried he will hurt himself if he leaves the ED. States he needs rehab from heroin and fears that he will kill himself if he leaves.    Pt medically cleared. COVID negative. TTS consulted.         Gareth Morgan, MD 06/24/19 [phone]

## 2019-06-23 NOTE — ED Notes (Signed)
Pt wanded and cleared by security. Inventoried belongings in locker 6.

## 2019-06-23 NOTE — ED Notes (Signed)
Per staffing, sitter will be available at 61
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
## 2019-06-24 DIAGNOSIS — F1124 Opioid dependence with opioid-induced mood disorder: Secondary | ICD-10-CM

## 2019-06-24 DIAGNOSIS — F332 Major depressive disorder, recurrent severe without psychotic features: Secondary | ICD-10-CM | POA: Diagnosis present

## 2019-06-24 DIAGNOSIS — F339 Major depressive disorder, recurrent, unspecified: Secondary | ICD-10-CM

## 2019-06-24 LAB — RAPID URINE DRUG SCREEN, HOSP PERFORMED
Amphetamines: NOT DETECTED
Barbiturates: NOT DETECTED
Benzodiazepines: NOT DETECTED
Cocaine: NOT DETECTED
Opiates: POSITIVE — AB
Tetrahydrocannabinol: NOT DETECTED

## 2019-06-24 LAB — ETHANOL: Alcohol, Ethyl (B): <10 mg/dL (ref <10)

## 2019-06-24 MED ORDER — LORAZEPAM 2 MG/ML IJ SOLN
1.0000 mg | Freq: Once | INTRAMUSCULAR | Status: AC
  Administered 2019-06-24: 1 mg via INTRAVENOUS
  Filled 2019-06-24: qty 1
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
## 2019-06-24 NOTE — ED Notes (Signed)
The pt wants something to help him sleep.  He reports that he has not had sleep  In 4 days.  althoough hehas been sleepong  Most of the time since 0330

## 2019-06-24 NOTE — ED Notes (Signed)
TTS to speak and evaluate pt at this time.

## 2019-06-24 NOTE — ED Provider Notes (Signed)
Pt was initially in the ED yesterday for cp, sob, and abdominal pain.  The pt did have an abnormal area on the cecum concerning for malignancy.  He was told to f/u with GI for a colonoscopy.  The pt's cp and sob were evaluated and everything looked good.  When he was getting ready for d/c, he told the doctor he was suicidal.  He was kept overnight for TTS eval.  He is no longer suicidal today.  He wants heroin rehab, but that is not something we admit for in the hospital.  He is d/c home with resources.  F/u with gi and with pcp.  Return if worse.   Isla Pence, MD 06/24/19 1401

## 2019-06-24 NOTE — ED Notes (Signed)
Urine collected and sent.

## 2019-06-24 NOTE — Progress Notes (Signed)
CSW received consult for substance use. CSW spoke with patient about opiate use and provided psychoeducation of impact of long-term opiate use. CSW discussed options for follow-up including residential placement and medication assisted treatment. CSW provided resources and contact information for these programs. CSW noted patient appeared open to medication assisted treatment. CSW signing off at this time, please re-consult for future social work needs.  Lamonte Richer, LCSW, [place] Worker II [phone]
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
## 2019-06-24 NOTE — ED Notes (Signed)
Pt back asleep when I returned to tell him edp tied up

## 2019-09-27 DEATH — deceased
